# Patient Record
Sex: Female | Born: 1960
Health system: Southern US, Community
[De-identification: ages and names within clinical notes are randomized; demographics above are authoritative.]

## PROBLEM LIST (undated history)

## (undated) DIAGNOSIS — I10 Essential (primary) hypertension: Secondary | ICD-10-CM

## (undated) DIAGNOSIS — Z789 Other specified health status: Secondary | ICD-10-CM

## (undated) DIAGNOSIS — R32 Unspecified urinary incontinence: Secondary | ICD-10-CM

## (undated) DIAGNOSIS — F411 Generalized anxiety disorder: Secondary | ICD-10-CM

## (undated) DIAGNOSIS — E119 Type 2 diabetes mellitus without complications: Secondary | ICD-10-CM

## (undated) DIAGNOSIS — F32A Depression, unspecified: Secondary | ICD-10-CM

## (undated) DIAGNOSIS — R519 Headache, unspecified: Secondary | ICD-10-CM

## (undated) DIAGNOSIS — F329 Major depressive disorder, single episode, unspecified: Secondary | ICD-10-CM

## (undated) DIAGNOSIS — R51 Headache: Secondary | ICD-10-CM

## (undated) DIAGNOSIS — G43909 Migraine, unspecified, not intractable, without status migrainosus: Secondary | ICD-10-CM

## (undated) DIAGNOSIS — M2550 Pain in unspecified joint: Secondary | ICD-10-CM

## (undated) HISTORY — DX: Other specified health status: Z78.9

## (undated) HISTORY — DX: Generalized anxiety disorder: F41.1

## (undated) HISTORY — DX: Depression, unspecified: F32.A

## (undated) HISTORY — DX: Major depressive disorder, single episode, unspecified: F32.9

## (undated) HISTORY — DX: Headache, unspecified: R51.9

## (undated) HISTORY — DX: Migraine, unspecified, not intractable, without status migrainosus: G43.909

## (undated) HISTORY — DX: Morbid (severe) obesity due to excess calories: E66.01

## (undated) HISTORY — PX: KNEE ARTHROSCOPY: SHX127

## (undated) HISTORY — DX: Essential (primary) hypertension: I10

## (undated) HISTORY — DX: Type 2 diabetes mellitus without complications: E11.9

## (undated) HISTORY — DX: Pain in unspecified joint: M25.50

## (undated) HISTORY — DX: Unspecified urinary incontinence: R32

## (undated) HISTORY — DX: Headache: R51

---

## 1997-04-12 ENCOUNTER — Other Ambulatory Visit: Admission: RE | Admit: 1997-04-12 | Discharge: 1997-04-12 | Payer: Self-pay | Admitting: Obstetrics and Gynecology

## 1999-11-11 ENCOUNTER — Other Ambulatory Visit: Admission: RE | Admit: 1999-11-11 | Discharge: 1999-11-11 | Payer: Self-pay | Admitting: Obstetrics and Gynecology

## 2000-11-24 ENCOUNTER — Other Ambulatory Visit: Admission: RE | Admit: 2000-11-24 | Discharge: 2000-11-24 | Payer: Self-pay | Admitting: Obstetrics and Gynecology

## 2002-11-15 ENCOUNTER — Other Ambulatory Visit: Admission: RE | Admit: 2002-11-15 | Discharge: 2002-11-15 | Payer: Self-pay | Admitting: Obstetrics and Gynecology

## 2004-10-28 ENCOUNTER — Other Ambulatory Visit: Admission: RE | Admit: 2004-10-28 | Discharge: 2004-10-28 | Payer: Self-pay | Admitting: Obstetrics and Gynecology

## 2009-03-21 ENCOUNTER — Encounter: Admission: RE | Admit: 2009-03-21 | Discharge: 2009-03-21 | Payer: Self-pay | Admitting: Obstetrics and Gynecology

## 2011-05-06 ENCOUNTER — Other Ambulatory Visit: Payer: Self-pay | Admitting: Orthopedic Surgery

## 2013-04-11 ENCOUNTER — Other Ambulatory Visit: Payer: Self-pay | Admitting: Obstetrics and Gynecology

## 2013-04-11 DIAGNOSIS — R928 Other abnormal and inconclusive findings on diagnostic imaging of breast: Secondary | ICD-10-CM

## 2013-04-27 ENCOUNTER — Ambulatory Visit
Admission: RE | Admit: 2013-04-27 | Discharge: 2013-04-27 | Disposition: A | Discharge status: Home / Self Care | Payer: Commercial Managed Care - PPO | Source: Ambulatory Visit | Attending: Obstetrics and Gynecology | Admitting: Obstetrics and Gynecology

## 2013-04-27 ENCOUNTER — Encounter (INDEPENDENT_AMBULATORY_CARE_PROVIDER_SITE_OTHER): Payer: Self-pay

## 2013-04-27 DIAGNOSIS — R928 Other abnormal and inconclusive findings on diagnostic imaging of breast: Secondary | ICD-10-CM

## 2016-02-06 DIAGNOSIS — N390 Urinary tract infection, site not specified: Secondary | ICD-10-CM | POA: Diagnosis not present

## 2016-02-06 DIAGNOSIS — Z1231 Encounter for screening mammogram for malignant neoplasm of breast: Secondary | ICD-10-CM | POA: Diagnosis not present

## 2016-02-06 DIAGNOSIS — Z124 Encounter for screening for malignant neoplasm of cervix: Secondary | ICD-10-CM | POA: Diagnosis not present

## 2016-02-06 DIAGNOSIS — Z01419 Encounter for gynecological examination (general) (routine) without abnormal findings: Secondary | ICD-10-CM | POA: Diagnosis not present

## 2016-08-09 DIAGNOSIS — R5383 Other fatigue: Secondary | ICD-10-CM | POA: Diagnosis not present

## 2016-08-09 DIAGNOSIS — L739 Follicular disorder, unspecified: Secondary | ICD-10-CM | POA: Diagnosis not present

## 2016-08-09 DIAGNOSIS — E785 Hyperlipidemia, unspecified: Secondary | ICD-10-CM | POA: Diagnosis not present

## 2016-08-09 DIAGNOSIS — I10 Essential (primary) hypertension: Secondary | ICD-10-CM | POA: Diagnosis not present

## 2016-09-10 DIAGNOSIS — M1712 Unilateral primary osteoarthritis, left knee: Secondary | ICD-10-CM | POA: Diagnosis not present

## 2016-10-30 DIAGNOSIS — J209 Acute bronchitis, unspecified: Secondary | ICD-10-CM | POA: Diagnosis not present

## 2016-10-30 DIAGNOSIS — R062 Wheezing: Secondary | ICD-10-CM | POA: Diagnosis not present

## 2016-12-07 DIAGNOSIS — M1712 Unilateral primary osteoarthritis, left knee: Secondary | ICD-10-CM | POA: Diagnosis not present

## 2016-12-08 ENCOUNTER — Other Ambulatory Visit: Payer: Self-pay

## 2016-12-08 DIAGNOSIS — I83893 Varicose veins of bilateral lower extremities with other complications: Secondary | ICD-10-CM

## 2016-12-16 DIAGNOSIS — M1712 Unilateral primary osteoarthritis, left knee: Secondary | ICD-10-CM | POA: Diagnosis not present

## 2016-12-23 DIAGNOSIS — M1712 Unilateral primary osteoarthritis, left knee: Secondary | ICD-10-CM | POA: Diagnosis not present

## 2017-01-13 DIAGNOSIS — I10 Essential (primary) hypertension: Secondary | ICD-10-CM | POA: Diagnosis not present

## 2017-01-18 DIAGNOSIS — L300 Nummular dermatitis: Secondary | ICD-10-CM | POA: Diagnosis not present

## 2017-01-18 DIAGNOSIS — L299 Pruritus, unspecified: Secondary | ICD-10-CM | POA: Diagnosis not present

## 2017-01-21 ENCOUNTER — Encounter: Payer: Self-pay | Admitting: Vascular Surgery

## 2017-01-21 ENCOUNTER — Ambulatory Visit (HOSPITAL_COMMUNITY)
Admission: RE | Admit: 2017-01-21 | Discharge: 2017-01-21 | Disposition: A | Discharge status: Home / Self Care | Payer: Commercial Managed Care - PPO | Source: Ambulatory Visit | Attending: Vascular Surgery | Admitting: Vascular Surgery

## 2017-01-21 ENCOUNTER — Ambulatory Visit: Payer: Commercial Managed Care - PPO | Admitting: Vascular Surgery

## 2017-01-21 VITALS — BP 130/75 | HR 73 | Resp 18 | Ht 70.0 in | Wt 276.0 lb

## 2017-01-21 DIAGNOSIS — I83893 Varicose veins of bilateral lower extremities with other complications: Secondary | ICD-10-CM | POA: Insufficient documentation

## 2017-01-21 DIAGNOSIS — I83899 Varicose veins of unspecified lower extremities with other complications: Secondary | ICD-10-CM | POA: Diagnosis not present

## 2017-01-21 DIAGNOSIS — I872 Venous insufficiency (chronic) (peripheral): Secondary | ICD-10-CM | POA: Diagnosis not present

## 2017-01-21 DIAGNOSIS — R609 Edema, unspecified: Secondary | ICD-10-CM | POA: Diagnosis present

## 2017-01-21 HISTORY — DX: Varicose veins of unspecified lower extremity with other complications: I83.899

## 2017-01-21 HISTORY — DX: Venous insufficiency (chronic) (peripheral): I87.2

## 2017-01-21 NOTE — Progress Notes (Signed)
Reason for referral: bleeding episodes over patches of varicose veins left LE  History of Present Illness  Jennifer Bishop is a 57 y.o. female who presents with chief complaint:Visable swollen varicose veins over the left lateral malleolus.  She has had 3 bleeding episodes over the past few years that took > than an hour to stop the bleeding.  The patient has had no history of DVT, no history of varicose vein, no history of venous stasis ulcers, no history of  Lymphedema and no history of skin changes in lower legs.  There is postive family history of venous disorders.  The patient has  used compression stockings in the past.  She has had 2 children.    Past medical history includes: HTN managed with a beta blocker and fluid pills.  She denise CAD, MI, and DM.    Social History   Socioeconomic History  . Marital status: Married    Spouse name: Not on file  . Number of children: Not on file  . Years of education: Not on file  . Highest education level: Not on file  Social Needs  . Financial resource strain: Not on file  . Food insecurity - worry: Not on file  . Food insecurity - inability: Not on file  . Transportation needs - medical: Not on file  . Transportation needs - non-medical: Not on file  Occupational History  . Not on file  Tobacco Use  . Smoking status: Never Smoker  . Smokeless tobacco: Never Used  Substance and Sexual Activity  . Alcohol use: Not on file  . Drug use: Not on file  . Sexual activity: Not on file  Other Topics Concern  . Not on file  Social History Narrative  . Not on file    Family History: patient is unable to detail the medical history of his parents   Current Outpatient Medications on File Prior to Visit  Medication Sig Dispense Refill  . celecoxib (CELEBREX) 200 MG capsule TAKE 1 CAPSULE BY MOUTH EVERY DAY AS NEEDED  0  . DULoxetine (CYMBALTA) 30 MG capsule Take 30 mg by mouth daily.    . DULoxetine (CYMBALTA) 60 MG capsule Take 60 mg by  mouth daily.    . hydrochlorothiazide (HYDRODIURIL) 25 MG tablet     . propranolol ER (INDERAL LA) 80 MG 24 hr capsule      No current facility-administered medications on file prior to visit.     Allergies as of 01/21/2017  . (Not on File)    ROS:   General:  No weight loss, Fever, chills  HEENT: No recent headaches, no nasal bleeding, no visual changes, no sore throat  Neurologic: No dizziness, blackouts, seizures. No recent symptoms of stroke or mini- stroke. No recent episodes of slurred speech, or temporary blindness.  Cardiac: No recent episodes of chest pain/pressure, no shortness of breath at rest.  No shortness of breath with exertion.  Denies history of atrial fibrillation or irregular heartbeat  Vascular: No history of rest pain in feet.  No history of claudication.  No history of non-healing ulcer, No history of DVT   Pulmonary: No home oxygen, no productive cough, no hemoptysis,  No asthma or wheezing  Musculoskeletal:  [ ]  Arthritis, [ ]  Low back pain,  [ ]  Joint pain  Hematologic:No history of hypercoagulable state.  positive history of easy bleeding left lateral ankle.  No history of anemia  Gastrointestinal: No hematochezia or melena,  No  gastroesophageal reflux, no trouble swallowing  Urinary: [ ]  chronic Kidney disease, [ ]  on HD - [ ]  MWF or [ ]  TTHS, [ ]  Burning with urination, [ ]  Frequent urination, [ ]  Difficulty urinating;   Skin: No rashes  Psychological: No history of anxiety,  No history of depression  Physical Examination  Vitals:   01/21/17 1507  BP: 130/75  Pulse: 73  Resp: 18  SpO2: 96%  Weight: 276 lb (125.2 kg)  Height: 5\' 10"  (1.778 m)    Body mass index is 39.6 kg/m.  General:  Alert and oriented, no acute distress HEENT: Normal Neck: No bruit or JVD Pulmonary: Clear to auscultation bilaterally Cardiac: Regular Rate and Rhythm without murmur Abdomen: Soft, non-tender, non-distended, no mass, no scars Skin: dependent bluish  color to B le.  No wounds.  Multiple areas of spider veins over the feet and ankles.  Lateral cluster of varicose veins over the lateral malleolus.   Extremity Pulses:  2+ radial, brachial, femoral, dorsalis pedis, posterior tibial pulses bilaterally Musculoskeletal: pitting edema bilateral LE  Neurologic: Upper and lower extremity motor 5/5 and symmetric Psychiatric: Judgment intact, Mood & affect appropriate for pt's clinical situation Lymph : No Cervical, Axillary, or Inguinal lymphadenopathy   Non-invasive Vascular Imaging   LLE Venous Reflux Duplex (01/21/2017) Venous Reflux Times Normal value < 0.5 sec +--------------+----------+---------+        Right (ms)Left (ms) +--------------+----------+---------+ CFV           2890.0   +--------------+----------+---------+ GSV at groin      2846.0   +--------------+----------+---------+ GSV prox thigh     2406.0   +--------------+----------+---------+ GSV mid thigh      2500.0   +--------------+----------+---------+  Vein Diameters: +-------------------+----------+---------+           Right (mm)Left (mm) +-------------------+----------+---------+ GSV at SFJ          0.97    +-------------------+----------+---------+ GSV at prox thigh      0.53    +-------------------+----------+---------+ GSV at mid thigh       0.40    +-------------------+----------+---------+ GSV at distal thigh     0.32    +-------------------+----------+---------+ GSV at knee         0.33    +-------------------+----------+---------+ GSV prox calf        0.25    +-------------------+----------+---------+ SSV             0.26    +-------------------+----------+---------+  Final Interpretation: Left: Abnormal reflux times were noted in the common femoral vein, great saphenous vein at the groin,  great saphenous vein at the proximal thigh, and great saphenous vein at the mid thigh. There is no evidence of deep vein thrombosis in the lower  extremity. A cystic structure is found in the popliteal fossa.  *See table(s) above for measurements and observations.   Assessment: Venous insufficiency with reflux in the GSV. 3 episodes of varicose vein bleeding lateral malleolus left ankle   Plan: We will schedule her to follow up in our vein clinic on the next available appointment.   She will be considered for Phlebectomy verses sclerotherapy or combination of the 2.  She has high reflux times and may be considered for GSV ablation in the future as well.  She will continue to wear thigh high compression garments daily and elevation of her lower extremities when at rest.    Mosetta Pigeon PA-C [facility]  The patient was seen in conjunction with Dr.  Chen today  Addendum  I have independently interviewed and examined the patient, and I agree with the physician assistant's findings.  Pt has significant superficial and deep venous reflux but her primary sx is bleeding from a spider vein nest fed by a moderate sized lateral varicosities.    - Suspect will need a combination of stab phlebectomy and sclerotherapy   Leonides SakeBrian Chen, MD, FACS [facility] [place] Office: [phone] Pager: [phone]  01/21/2017, 4:14 PM

## 2017-02-01 ENCOUNTER — Ambulatory Visit: Payer: Commercial Managed Care - PPO | Admitting: Vascular Surgery

## 2017-02-01 ENCOUNTER — Encounter: Payer: Self-pay | Admitting: Vascular Surgery

## 2017-02-01 VITALS — BP 132/78 | HR 62 | Temp 98.7°F | Resp 18 | Ht 70.0 in | Wt 273.0 lb

## 2017-02-01 DIAGNOSIS — I83891 Varicose veins of right lower extremities with other complications: Secondary | ICD-10-CM

## 2017-02-01 DIAGNOSIS — I83892 Varicose veins of left lower extremities with other complications: Secondary | ICD-10-CM

## 2017-02-01 HISTORY — DX: Varicose veins of left lower extremity with other complications: I83.892

## 2017-02-01 NOTE — Progress Notes (Signed)
Subjective:     Patient ID: Jennifer Bishop, female   DOB: 03/01/1960, 57 y.o.   MRN: [id number]  HPI This 57 year old female returns for follow-up regarding her history of bleeding varicosities in the left leg. She was seen by Dr. Leonides SakeBrian Chen 2 weeks ago. She has had 3 episodes of bleeding over the past few years involving a varix at the lateral malleolar areain the left ankle. She has no history of DVT thrombophlebitis stasis ulcers. Her most recent episode of bleeding from the left lateral ankle was 5 weeks ago. She has been wearing short leg elastic compression stockings. She develops heavy aching discomfort in the legs as the day progresses and some swelling in the left ankle. This is affecting her daily living and is she is very concerned about further bleeding episodes.  Past Medical History:  Diagnosis Date  . Hypertension     Social History   Tobacco Use  . Smoking status: Never Smoker  . Smokeless tobacco: Never Used  Substance Use Topics  . Alcohol use: Not on file    Family History  Problem Relation Age of Onset  . Heart disease Son     Not on File   Current Outpatient Medications:  .  celecoxib (CELEBREX) 200 MG capsule, TAKE 1 CAPSULE BY MOUTH EVERY DAY AS NEEDED, Disp: , Rfl: 0 .  DULoxetine (CYMBALTA) 30 MG capsule, Take 30 mg by mouth daily., Disp: , Rfl:  .  DULoxetine (CYMBALTA) 60 MG capsule, Take 60 mg by mouth daily., Disp: , Rfl:  .  hydrochlorothiazide (HYDRODIURIL) 25 MG tablet, , Disp: , Rfl:  .  propranolol ER (INDERAL LA) 80 MG 24 hr capsule, , Disp: , Rfl:   Vitals:   02/01/17 1532  BP: 132/78  Pulse: 62  Resp: 18  Temp: 98.7 F (37.1 C)  TempSrc: Oral  SpO2: 96%  Weight: 273 lb (123.8 kg)  Height: 5\' 10"  (1.778 m)    Body mass index is 39.17 kg/m.         Review of Systems  Denies chest pain, dyspnea on exertion, PND, orthopnea, hemoptysis, claudication    Objective:   Physical Exam BP 132/78 (BP Location: Left Arm, Patient  Position: Sitting, Cuff Size: Normal)   Pulse 62   Temp 98.7 F (37.1 C) (Oral)   Resp 18   Ht 5\' 10"  (1.778 m)   Wt 273 lb (123.8 kg)   SpO2 96%   BMI 39.17 kg/m   Gen. Obese female no apparent distress alert and oriented 3 Lungs no rhonchi or wheezing Left leg withbulging varicosities in the distal lateral aspect extending down to the lateral malleolus where the bleeding sitewas located. Some smaller varicosities in the medial calf and some reticular veins. 1+ distal edema with no hyperpigmentation or ulceration noted.  I reviewed the previous reflux ultrasound exam from last visit and also performed an independent bedside SonoSite ultrasound exam which revealed enlargement of the left great saphenous vein with gross reflux down to the distal thigh     Assessment:     Gross reflux left great saphenous vein with pain and swelling and 3 episodes of on tiniest bleeding from the varix left ankle. This is affecting patient's dailyving and she is quite concerned about future bleeding episodes.    Plan:     Patient needs laser ablation left great saphenous vein followed by a session of foam sclerotherapy to treat the bleeding site. We will proceed with precertification to perform this in  the near future hopefully prevent further bleeding

## 2017-03-08 ENCOUNTER — Other Ambulatory Visit: Payer: Self-pay | Admitting: *Deleted

## 2017-03-08 DIAGNOSIS — I83892 Varicose veins of left lower extremities with other complications: Secondary | ICD-10-CM

## 2017-05-03 ENCOUNTER — Ambulatory Visit (INDEPENDENT_AMBULATORY_CARE_PROVIDER_SITE_OTHER): Payer: Commercial Managed Care - PPO | Admitting: Vascular Surgery

## 2017-05-03 ENCOUNTER — Encounter: Payer: Self-pay | Admitting: Vascular Surgery

## 2017-05-03 VITALS — BP 148/82 | HR 66 | Temp 98.0°F | Resp 16 | Ht 70.0 in | Wt 273.0 lb

## 2017-05-03 DIAGNOSIS — I83892 Varicose veins of left lower extremities with other complications: Secondary | ICD-10-CM

## 2017-05-03 HISTORY — PX: ENDOVENOUS ABLATION SAPHENOUS VEIN W/ LASER: SUR449

## 2017-05-03 NOTE — Progress Notes (Signed)
Laser Ablation Procedure    Date: 05/03/2017   Jennifer Bishop DOB:1960-12-02  Consent signed: Yes    Surgeon:  Dr. Quita Skye. [facility] [place]  Procedure: Laser Ablation: left Greater Saphenous Vein  BP (!) 148/82 (BP Location: Left Arm, Patient Position: Sitting, Cuff Size: Large)   Pulse 66   Temp 98 F (36.7 C) (Oral)   Resp 16   Ht  (1.778 m)   Wt 273 lb (123.8 kg)   SpO2 97%   BMI 39.17 kg/m   Tumescent Anesthesia: 350 cc 0.9% NaCl with 50 cc Lidocaine HCL 1% and 15 cc 8.4% NaHCO3  Local Anesthesia: 4 cc Lidocaine HCL and NaHCO3 (ratio 2:1)  Pulsed Mode: 15 watts, delay, 1.0 duration  Total Energy: 1541 Joules             Total Pulses: 103               Total Time: 1:43    Patient tolerated procedure well    Description of Procedure:  After marking the course of the secondary varicosities, the patient was placed on the operating table in the supine position, and the left leg was prepped and draped in sterile fashion.   Local anesthetic was administered and under ultrasound guidance the saphenous vein was accessed with a micro needle and guide wire; then the mirco puncture sheath was placed.  A guide wire was inserted saphenofemoral junction , followed by a 5 french sheath.  The position of the sheath and then the laser fiber below the junction was confirmed using the ultrasound.  Tumescent anesthesia was administered along the course of the saphenous vein using ultrasound guidance. The patient was placed in Trendelenburg position and protective laser glasses were placed on patient and staff, and the laser was fired at 15 watts continuous mode advancing 1-73mm/second for a total of 1541 joules.    Steri strip was applied to the IV insertion site and ABD pads and thigh high compression stockings were applied.  Ace wrap bandages were applied over the left calf and thigh and at the top of the saphenofemoral junction. Blood loss was less than 15 cc.  The patient ambulated out of the  operating room having tolerated the procedure well.

## 2017-05-03 NOTE — Progress Notes (Signed)
  Subjective:     Patient ID: Jennifer Bishop, female   DOB: 02-03-1960, 57 y.o.   MRN: [id number]  HPI This 57 year old female had laser ablation of the left great saphenous vein from the distal thigh to near the saphenofemoral junction performed under local tumescent anesthesia.  A total of 1541 J of energy was utilized.  She tolerated procedure well.  Review of Systems     Objective:   Physical Exam BP (!) 148/82 (BP Location: Left Arm, Patient Position: Sitting, Cuff Size: Large)   Pulse 66   Temp 98 F (36.7 C) (Oral)   Resp 16   Ht  (1.778 m)   Wt 273 lb (123.8 kg)   SpO2 97%   BMI 39.17 kg/m        Assessment:     Well-tolerated laser ablation left great saphenous vein performed under local tumescent anesthesia for gross reflux with history of bleeding from ankle varix on 3 occasions    Plan:     Return to see me in 1 week for venous duplex exam to confirm closure left great saphenous vein and patient will then have one course of foam sclerotherapy at the bleeding site

## 2017-05-05 DIAGNOSIS — M2550 Pain in unspecified joint: Secondary | ICD-10-CM | POA: Diagnosis not present

## 2017-05-05 DIAGNOSIS — M791 Myalgia, unspecified site: Secondary | ICD-10-CM | POA: Diagnosis not present

## 2017-05-05 DIAGNOSIS — M150 Primary generalized (osteo)arthritis: Secondary | ICD-10-CM | POA: Diagnosis not present

## 2017-05-09 ENCOUNTER — Other Ambulatory Visit: Payer: Self-pay

## 2017-05-09 ENCOUNTER — Encounter: Payer: Self-pay | Admitting: Vascular Surgery

## 2017-05-09 ENCOUNTER — Ambulatory Visit (HOSPITAL_COMMUNITY)
Admission: RE | Admit: 2017-05-09 | Discharge: 2017-05-09 | Disposition: A | Discharge status: Home / Self Care | Payer: Commercial Managed Care - PPO | Source: Ambulatory Visit | Attending: Vascular Surgery | Admitting: Vascular Surgery

## 2017-05-09 ENCOUNTER — Ambulatory Visit (INDEPENDENT_AMBULATORY_CARE_PROVIDER_SITE_OTHER): Payer: Commercial Managed Care - PPO | Admitting: Vascular Surgery

## 2017-05-09 VITALS — BP 134/73 | HR 68 | Resp 18 | Ht 70.0 in | Wt 285.0 lb

## 2017-05-09 DIAGNOSIS — I83892 Varicose veins of left lower extremities with other complications: Secondary | ICD-10-CM | POA: Diagnosis not present

## 2017-05-09 DIAGNOSIS — Z9889 Other specified postprocedural states: Secondary | ICD-10-CM | POA: Diagnosis not present

## 2017-05-09 NOTE — Progress Notes (Signed)
  Subjective:     Patient ID: Jennifer Bishop, female   DOB: October 31, 1960, 57 y.o.   MRN: [id number]  HPI This 57 year old female returns 1 week post laser ablation left great saphenous vein performed for gross reflux with painful varicosities and a history of bleeding.  She has worn her long-leg elastic compression stockings 20-30 mm gradient and taken ibuprofen as instructed.  He has had minimal discomfort in the left thigh and has had improvement in her distal edema.  Has no complaints.  She is had no recurrent bleeding.  Past Medical History:  Diagnosis Date  . Hypertension     Social History   Tobacco Use  . Smoking status: Never Smoker  . Smokeless tobacco: Never Used  Substance Use Topics  . Alcohol use: Not on file    Family History  Problem Relation Age of Onset  . Heart disease Son     No Known Allergies   Current Outpatient Medications:  .  celecoxib (CELEBREX) 200 MG capsule, TAKE 1 CAPSULE BY MOUTH EVERY DAY AS NEEDED, Disp: , Rfl: 0 .  DULoxetine (CYMBALTA) 30 MG capsule, Take 30 mg by mouth daily., Disp: , Rfl:  .  DULoxetine (CYMBALTA) 60 MG capsule, Take 60 mg by mouth daily., Disp: , Rfl:  .  hydrochlorothiazide (HYDRODIURIL) 25 MG tablet, , Disp: , Rfl:  .  propranolol ER (INDERAL LA) 80 MG 24 hr capsule, , Disp: , Rfl:   Vitals:   05/09/17 1402  BP: 134/73  Pulse: 68  Resp: 18  SpO2: 96%  Weight: 285 lb (129.3 kg)  Height:  (1.778 m)    Body mass index is 40.89 kg/m.         Review of Systems Denies chest pain, dyspnea on exertion, PND, orthopnea, hemoptysis, claudication    Objective:   Physical Exam BP 134/73 (BP Location: Left Arm, Patient Position: Sitting, Cuff Size: Normal)   Pulse 68   Resp 18   Ht  (1.778 m)   Wt 285 lb (129.3 kg)   SpO2 96%   BMI 40.89 kg/m   Obese female no apparent distress alert and oriented x3 Lungs no rhonchi or wheezing Left leg with mild discomfort to deep palpation of her great saphenous  vein with mild ecchymosis in mid to distal thigh.  Trace distal edema present.  3+ dorsalis pedis pulse palpable.  Today ordered a venous duplex exam of the left leg which I reviewed and interpreted.  There is no DVT.  There is total closure of the saphenous vein up to near the saphenofemoral junction     Assessment:     #1 successful laser ablation left great saphenous vein for gross reflux with swelling and history of bleeding left ankle with good early result    Plan:    Patient will arrange an appointment with Clementeen Hoof  in the near future for sclerotherapy to complete her treatment regimen

## 2017-05-10 ENCOUNTER — Ambulatory Visit: Payer: Commercial Managed Care - PPO | Admitting: Vascular Surgery

## 2017-05-10 ENCOUNTER — Encounter (HOSPITAL_COMMUNITY): Payer: Commercial Managed Care - PPO

## 2017-06-06 DIAGNOSIS — M1712 Unilateral primary osteoarthritis, left knee: Secondary | ICD-10-CM | POA: Diagnosis not present

## 2017-06-26 DIAGNOSIS — S335XXA Sprain of ligaments of lumbar spine, initial encounter: Secondary | ICD-10-CM | POA: Diagnosis not present

## 2017-07-10 DIAGNOSIS — M545 Low back pain: Secondary | ICD-10-CM | POA: Diagnosis not present

## 2017-07-10 DIAGNOSIS — S3992XA Unspecified injury of lower back, initial encounter: Secondary | ICD-10-CM | POA: Diagnosis not present

## 2017-07-15 DIAGNOSIS — M545 Low back pain: Secondary | ICD-10-CM | POA: Diagnosis not present

## 2017-07-28 DIAGNOSIS — M461 Sacroiliitis, not elsewhere classified: Secondary | ICD-10-CM | POA: Diagnosis not present

## 2017-07-28 DIAGNOSIS — M7611 Psoas tendinitis, right hip: Secondary | ICD-10-CM | POA: Diagnosis not present

## 2017-07-28 DIAGNOSIS — M5417 Radiculopathy, lumbosacral region: Secondary | ICD-10-CM | POA: Diagnosis not present

## 2017-08-05 DIAGNOSIS — M7611 Psoas tendinitis, right hip: Secondary | ICD-10-CM | POA: Diagnosis not present

## 2017-08-05 DIAGNOSIS — M5417 Radiculopathy, lumbosacral region: Secondary | ICD-10-CM | POA: Diagnosis not present

## 2017-08-05 DIAGNOSIS — M461 Sacroiliitis, not elsewhere classified: Secondary | ICD-10-CM | POA: Diagnosis not present

## 2017-09-02 DIAGNOSIS — M47896 Other spondylosis, lumbar region: Secondary | ICD-10-CM | POA: Diagnosis not present

## 2017-09-02 DIAGNOSIS — M13851 Other specified arthritis, right hip: Secondary | ICD-10-CM | POA: Diagnosis not present

## 2017-09-06 DIAGNOSIS — Z6841 Body Mass Index (BMI) 40.0 and over, adult: Secondary | ICD-10-CM | POA: Diagnosis not present

## 2017-09-06 DIAGNOSIS — R5383 Other fatigue: Secondary | ICD-10-CM | POA: Diagnosis not present

## 2017-09-06 DIAGNOSIS — N390 Urinary tract infection, site not specified: Secondary | ICD-10-CM | POA: Diagnosis not present

## 2017-09-06 DIAGNOSIS — Z131 Encounter for screening for diabetes mellitus: Secondary | ICD-10-CM | POA: Diagnosis not present

## 2017-09-06 DIAGNOSIS — Z13220 Encounter for screening for lipoid disorders: Secondary | ICD-10-CM | POA: Diagnosis not present

## 2017-09-06 DIAGNOSIS — I10 Essential (primary) hypertension: Secondary | ICD-10-CM | POA: Diagnosis not present

## 2017-09-12 DIAGNOSIS — Z6841 Body Mass Index (BMI) 40.0 and over, adult: Secondary | ICD-10-CM | POA: Diagnosis not present

## 2017-09-12 DIAGNOSIS — E1165 Type 2 diabetes mellitus with hyperglycemia: Secondary | ICD-10-CM | POA: Diagnosis not present

## 2017-10-14 DIAGNOSIS — Z1231 Encounter for screening mammogram for malignant neoplasm of breast: Secondary | ICD-10-CM | POA: Diagnosis not present

## 2017-10-14 DIAGNOSIS — Z01419 Encounter for gynecological examination (general) (routine) without abnormal findings: Secondary | ICD-10-CM | POA: Diagnosis not present

## 2017-11-01 DIAGNOSIS — M1712 Unilateral primary osteoarthritis, left knee: Secondary | ICD-10-CM | POA: Diagnosis not present

## 2017-11-04 DIAGNOSIS — E1165 Type 2 diabetes mellitus with hyperglycemia: Secondary | ICD-10-CM | POA: Diagnosis not present

## 2017-11-04 DIAGNOSIS — Z6841 Body Mass Index (BMI) 40.0 and over, adult: Secondary | ICD-10-CM | POA: Diagnosis not present

## 2018-01-13 DIAGNOSIS — E1165 Type 2 diabetes mellitus with hyperglycemia: Secondary | ICD-10-CM | POA: Diagnosis not present

## 2018-01-19 DIAGNOSIS — Z6841 Body Mass Index (BMI) 40.0 and over, adult: Secondary | ICD-10-CM | POA: Diagnosis not present

## 2018-01-19 DIAGNOSIS — E1165 Type 2 diabetes mellitus with hyperglycemia: Secondary | ICD-10-CM | POA: Diagnosis not present

## 2018-01-25 DIAGNOSIS — E1165 Type 2 diabetes mellitus with hyperglycemia: Secondary | ICD-10-CM | POA: Diagnosis not present

## 2018-02-21 DIAGNOSIS — M25562 Pain in left knee: Secondary | ICD-10-CM | POA: Diagnosis not present

## 2018-02-21 DIAGNOSIS — M1712 Unilateral primary osteoarthritis, left knee: Secondary | ICD-10-CM | POA: Diagnosis not present

## 2018-03-17 DIAGNOSIS — J302 Other seasonal allergic rhinitis: Secondary | ICD-10-CM | POA: Diagnosis not present

## 2018-03-17 DIAGNOSIS — Z6841 Body Mass Index (BMI) 40.0 and over, adult: Secondary | ICD-10-CM | POA: Diagnosis not present

## 2018-04-27 ENCOUNTER — Ambulatory Visit: Payer: Commercial Managed Care - PPO | Admitting: Allergy and Immunology

## 2018-04-27 ENCOUNTER — Other Ambulatory Visit: Payer: Self-pay

## 2018-04-27 ENCOUNTER — Encounter: Payer: Self-pay | Admitting: Allergy and Immunology

## 2018-04-27 VITALS — BP 152/82 | HR 56 | Temp 98.1°F | Resp 16 | Ht 67.6 in | Wt 264.8 lb

## 2018-04-27 DIAGNOSIS — H1010 Acute atopic conjunctivitis, unspecified eye: Secondary | ICD-10-CM | POA: Diagnosis not present

## 2018-04-27 DIAGNOSIS — J3089 Other allergic rhinitis: Secondary | ICD-10-CM | POA: Diagnosis not present

## 2018-04-27 DIAGNOSIS — Z79899 Other long term (current) drug therapy: Secondary | ICD-10-CM

## 2018-04-27 MED ORDER — HYDROCHLOROTHIAZIDE 12.5 MG PO TABS: 12.5000 mg | ORAL_TABLET | Freq: Every day | ORAL | 1 refills | Status: DC

## 2018-04-27 MED ORDER — LOSARTAN POTASSIUM 50 MG PO TABS: 50.0000 mg | ORAL_TABLET | Freq: Every day | ORAL | 1 refills | Status: DC

## 2018-04-27 MED ORDER — MONTELUKAST SODIUM 10 MG PO TABS: 10.0000 mg | ORAL_TABLET | Freq: Every day | ORAL | 1 refills | Status: DC

## 2018-04-27 NOTE — Patient Instructions (Addendum)
  1.  Allergen avoidance measures?  2.  Treat and prevent inflammation:   A.  OTC Nasacort-1-2 sprays each nostril daily  B.  Montelukast 10 mg - 1 tablet daily  C.  Prednisone 10 mg - 1 tablet daily for 5 days only (sugars)  3.  Change lisinopril- hctz to:   A.  Losartan 50 mg -1 tablet daily  B.  Hydrochlorothiazide -12.5 mg 1 tablet daily  C.  Check blood pressure  4.  If needed:   A.  Nasal saline  B.  OTC Pataday 1 drop each eye daily  C.  OTC Allegra 180- 1 tablet daily  5.  Return to clinic in 3 weeks or earlier if problem

## 2018-04-27 NOTE — Progress Notes (Signed)
[place] - [place] - [place] - [place] - [place]   Dear Dr. Jeanie Sewer,  Thank you for referring Jennifer Bishop to the [facility] [place] on 04/27/2018.   Below is a summation of this patient's evaluation and recommendations.  Thank you for your referral. I will keep you informed about this patient's response to treatment.   If you have any questions please do not hesitate to contact me.   Sincerely,  Jessica Priest, MD Allergy / Immunology [facility]   ______________________________________________________________________    NEW PATIENT NOTE  Referring Provider: Noni Saupe, MD Primary Provider: Noni Saupe, MD Date of office visit: 04/27/2018    Subjective:   Chief Complaint:  Jennifer Bishop (DOB: May 21, 1960) is a 58 y.o. female who presents to the clinic on 04/27/2018 with a chief complaint of Itchy Eye and Cough .     HPI: Jennifer Bishop presents to this clinic in evaluation of allergic problems.  Jennifer Bishop states that since the very beginning of March she has had issues with itchy watery eyes and some sneezing and very itchy throat and some throat clearing and coughing.  She has not had any ugly nasal discharge or anosmia or headaches or shortness of breath or chest tightness or chest pain.  She does not really note any obvious provoking factor giving rise to this issue.  She has tried various antihistamines and feels as though Jennifer Bishop works the best.  It should be noted that she was started on lisinopril sometime in 2020 as hydrochlorothiazide was inadequate in controlling her blood pressure.  She actually feels as though her blood pressure is less well managed since starting lisinopril.  Past Medical History:  Diagnosis Date  . Depression   . Diabetes (HCC)   . Headache   . Hypertension   . Urinary incontinence     Past Surgical History:  Procedure Laterality Date  .  ENDOVENOUS ABLATION SAPHENOUS VEIN W/ LASER Left 05/03/2017   endovenous laser ablation Left greater saphenous vein by Josephina Gip MD   . KNEE ARTHROSCOPY     1993 and 2015    Allergies as of 04/27/2018   No Known Allergies     Medication List      aspirin 81 MG tablet Take 81 mg by mouth daily.   DULoxetine 60 MG capsule Commonly known as:  CYMBALTA Take 60 mg by mouth daily.   DULoxetine 30 MG capsule Commonly known as:  CYMBALTA Take 30 mg by mouth daily.   lisinopril-hydrochlorothiazide 10-12.5 MG tablet Commonly known as:  ZESTORETIC   metFORMIN 500 MG 24 hr tablet Commonly known as:  GLUCOPHAGE-XR Take 1,500 mg by mouth daily.   MULTIVITAMIN WOMEN PO Take by mouth daily.   naproxen 500 MG tablet Commonly known as:  NAPROSYN Take 500 mg by mouth 2 (two) times a day.   oxybutynin 5 MG 24 hr tablet Commonly known as:  DITROPAN-XL Take 5 mg by mouth daily.   pravastatin 20 MG tablet Commonly known as:  PRAVACHOL Take 20 mg by mouth daily.   propranolol ER 80 MG 24 hr capsule Commonly known as:  INDERAL LA Take 80 mg by mouth daily.       Review of systems negative except as noted in HPI / PMHx or noted below:  Review of Systems  Constitutional: Negative.   HENT: Negative.   Eyes: Negative.   Respiratory: Negative.  Cardiovascular: Negative.   Gastrointestinal: Negative.   Genitourinary: Negative.   Musculoskeletal: Negative.   Skin: Negative.   Neurological: Negative.   Endo/Heme/Allergies: Negative.   Psychiatric/Behavioral: Negative.     Family History  Problem Relation Age of Onset  . Heart disease Son   . Heart attack Son   . Congestive Heart Failure Mother   . Heart disease Mother   . Heart attack Father   . Diabetes Father   . COPD Father   . Asthma Sister     Social History   Socioeconomic History  . Marital status: Married    Spouse name: Not on file  . Number of children: Not on file  . Years of education: Not on file   . Highest education level: Not on file  Occupational History  . Not on file  Social Needs  . Financial resource strain: Not on file  . Food insecurity:    Worry: Not on file    Inability: Not on file  . Transportation needs:    Medical: Not on file    Non-medical: Not on file  Tobacco Use  . Smoking status: Never Smoker  . Smokeless tobacco: Never Used  Substance and Sexual Activity  . Alcohol use: Never    Frequency: Never  . Drug use: Never  . Sexual activity: Not on file  Lifestyle  . Physical activity:    Days per week: Not on file    Minutes per session: Not on file  . Stress: Not on file  Relationships  . Social connections:    Talks on phone: Not on file    Gets together: Not on file    Attends religious service: Not on file    Active member of club or organization: Not on file    Attends meetings of clubs or organizations: Not on file    Relationship status: Not on file  . Intimate partner violence:    Fear of current or ex partner: Not on file    Emotionally abused: Not on file    Physically abused: Not on file    Forced sexual activity: Not on file  Other Topics Concern  . Not on file  Social History Narrative  . Not on file    Environmental and Social history  Lives in a house with a dry environment, cats located inside the household, no carpet in the bedroom, no plastic on the bed, no plastic on the pillow, no smoking ongoing with inside the household.  She works in an office setting.  Objective:   Vitals:   04/27/18 1351  BP: (!) 152/82  Pulse: (!) 56  Resp: 16  Temp: 98.1 F (36.7 C)  SpO2: 98%   Height: 5' 7.6" (171.7 cm) Weight: 264 lb 12.8 oz (120.1 kg)  Physical Exam Constitutional:      Appearance: She is not diaphoretic.  HENT:     Head: Normocephalic. No right periorbital erythema or left periorbital erythema.     Right Ear: Tympanic membrane, ear canal and external ear normal.     Left Ear: Tympanic membrane, ear canal and  external ear normal.     Nose: Mucosal edema present. No rhinorrhea.     Mouth/Throat:     Pharynx: No oropharyngeal exudate.  Eyes:     General: Lids are normal.     Conjunctiva/sclera:     Right eye: Right conjunctiva is injected.     Left eye: Left conjunctiva is injected.  Pupils: Pupils are equal, round, and reactive to light.  Neck:     Thyroid: No thyromegaly.     Trachea: Trachea normal. No tracheal deviation.  Cardiovascular:     Rate and Rhythm: Normal rate and regular rhythm.     Heart sounds: Normal heart sounds, S1 normal and S2 normal. No murmur.  Pulmonary:     Effort: Pulmonary effort is normal. No respiratory distress.     Breath sounds: No stridor. No wheezing or rales.  Chest:     Chest wall: No tenderness.  Abdominal:     General: There is no distension.     Palpations: Abdomen is soft. There is no mass.     Tenderness: There is no abdominal tenderness. There is no guarding or rebound.  Musculoskeletal:        General: No tenderness.  Lymphadenopathy:     Head:     Right side of head: No tonsillar adenopathy.     Left side of head: No tonsillar adenopathy.     Cervical: No cervical adenopathy.  Skin:    Coloration: Skin is not pale.     Findings: No erythema or rash.     Nails: There is no clubbing.   Neurological:     Mental Status: She is alert.     Diagnostics: Allergy skin tests were performed.  She did not demonstrate any hypersensitivity against a screening panel of aeroallergens or foods.  Assessment and Plan:    1. Perennial allergic rhinitis   2. Seasonal allergic conjunctivitis   3. On angiotensin-converting enzyme (ACE) inhibitors     1.  Allergen avoidance measures?  2.  Treat and prevent inflammation:   A.  OTC Nasacort-1-2 sprays each nostril daily  B.  Montelukast 10 mg - 1 tablet daily  C.  Prednisone 10 mg - 1 tablet daily for 5 days only (sugars)  3.  Change lisinopril- hctz to:   A.  Losartan 50 mg -1 tablet daily   B.  Hydrochlorothiazide -12.5 mg 1 tablet daily  C.  Check blood pressure  4.  If needed:   A.  Nasal saline  B.  OTC Pataday 1 drop each eye daily  C.  OTC Allegra 180- 1 tablet daily  5.  Return to clinic in 3 weeks or earlier if problem  [place] appears to have some inflammation of her airway and we will start her on anti-inflammatory agents as noted above.  I could not really provide her any allergen avoidance measures as she did not demonstrate any hypersensitivity against our screening panel of aeroallergens during today's visit.  Some of your irritation of her airway may also be used to the recent introduction of lisinopril and will change her blood pressure medicine as noted above.  I will see her back in this clinic in 3 weeks to make a decision about further evaluation and treatment based upon her response.  Jessica Priest, MD Allergy / Immunology [facility]

## 2018-05-01 ENCOUNTER — Encounter: Payer: Self-pay | Admitting: Allergy and Immunology

## 2018-05-04 ENCOUNTER — Other Ambulatory Visit: Payer: Self-pay | Admitting: *Deleted

## 2018-05-04 ENCOUNTER — Telehealth: Payer: Self-pay | Admitting: *Deleted

## 2018-05-04 MED ORDER — LOSARTAN POTASSIUM 50 MG PO TABS: 50.0000 mg | ORAL_TABLET | Freq: Every day | ORAL | 1 refills | Status: DC

## 2018-05-04 NOTE — Telephone Encounter (Signed)
Jennifer Bishop is unable to get the Losartan that you prescribed. She states that the pharmacy told her it was unavailable now. Do you want to change it to something else? She did pick up the HCTZ.

## 2018-05-04 NOTE — Telephone Encounter (Signed)
Made some calls, there are still some current recalls but there are some pharmacies that have Losartan available that is not on the recall list. I will send the rx to [facility], pt aware.

## 2018-05-04 NOTE — Telephone Encounter (Signed)
All pharmacies? All distributors? She needs Losartan.

## 2018-05-18 ENCOUNTER — Ambulatory Visit: Payer: Commercial Managed Care - PPO | Admitting: Allergy and Immunology

## 2018-05-18 ENCOUNTER — Encounter: Payer: Self-pay | Admitting: Allergy and Immunology

## 2018-05-18 ENCOUNTER — Other Ambulatory Visit: Payer: Self-pay

## 2018-05-18 VITALS — BP 130/74 | HR 60 | Temp 98.2°F | Resp 16

## 2018-05-18 DIAGNOSIS — H1010 Acute atopic conjunctivitis, unspecified eye: Secondary | ICD-10-CM

## 2018-05-18 DIAGNOSIS — J3089 Other allergic rhinitis: Secondary | ICD-10-CM

## 2018-05-18 NOTE — Progress Notes (Signed)
[place]   Follow-up Note  Referring Provider: Noni Saupeedding, John F. II, MD Primary Provider: Noni Saupeedding, John F. II, MD Date of Office Visit: 05/18/2018  Subjective:   Jennifer Bishop (DOB: 10/02/1960) is a 58 y.o. female who [facility] the [facility] on 05/18/2018 in re-evaluation of the following:  HPI: Jennifer Bishop returns to this clinic in reevaluation of respiratory tract symptoms believed secondary to a component of airway inflammation and reflux and ACE inhibitor use addressed during her initial evaluation of 27 April 2018.  She is over 90% better regarding her cough.  She still has a little bit of throat clearing but her throat issue has improved significantly.  She has no upper airway symptoms.  She continues on montelukast but no longer uses any of the other medications prescribed for her airway during her last visit.  She does continue on her losartan and hydrochlorothiazide.  Allergies as of 05/18/2018   No Known Allergies     Medication List      aspirin 81 MG tablet Take 81 mg by mouth daily.   DULoxetine 60 MG capsule Commonly known as:  CYMBALTA Take 60 mg by mouth daily.   DULoxetine 30 MG capsule Commonly known as:  CYMBALTA Take 30 mg by mouth daily.   hydrochlorothiazide 12.5 MG tablet Commonly known as:  HYDRODIURIL Take 1 tablet (12.5 mg total) by mouth daily.   losartan 50 MG tablet Commonly known as:  COZAAR Take 1 tablet (50 mg total) by mouth daily.   metFORMIN 500 MG 24 hr tablet Commonly known as:  GLUCOPHAGE-XR Take 1,500 mg by mouth daily.   montelukast 10 MG tablet Commonly known as:  SINGULAIR Take 1 tablet (10 mg total) by mouth at bedtime.   MULTIVITAMIN WOMEN PO Take by mouth daily.   naproxen 500 MG tablet Commonly known as:  NAPROSYN Take 500 mg by mouth 2 (two) times a day.   Nasacort Allergy 24HR 55 MCG/ACT Aero nasal inhaler Generic drug:  triamcinolone Place 2 sprays  into the nose daily.   oxybutynin 5 MG 24 hr tablet Commonly known as:  DITROPAN-XL Take 5 mg by mouth daily.   pravastatin 20 MG tablet Commonly known as:  PRAVACHOL Take 20 mg by mouth daily.   propranolol ER 80 MG 24 hr capsule Commonly known as:  INDERAL LA Take 80 mg by mouth daily.       Past Medical History:  Diagnosis Date  . Depression   . Diabetes (HCC)   . Headache   . Hypertension   . Urinary incontinence     Past Surgical History:  Procedure Laterality Date  . ENDOVENOUS ABLATION SAPHENOUS VEIN W/ LASER Left 05/03/2017   endovenous laser ablation Left greater saphenous vein by Josephina GipJames [place] MD   . KNEE ARTHROSCOPY     1993 and 2015    Review of systems negative except as noted in HPI / PMHx or noted below:  Review of Systems  Constitutional: Negative.   HENT: Negative.   Eyes: Negative.   Respiratory: Negative.   Cardiovascular: Negative.   Gastrointestinal: Negative.   Genitourinary: Negative.   Musculoskeletal: Negative.   Skin: Negative.   Neurological: Negative.   Endo/Heme/Allergies: Negative.   Psychiatric/Behavioral: Negative.      Objective:   Vitals:   05/18/18 1141  BP: 130/74  Pulse: 60  Resp: 16  Temp: 98.2 F (36.8 C)  Physical Exam Constitutional:      Appearance: She is not diaphoretic.     Comments: Throat clearing  HENT:     Head: Normocephalic.     Right Ear: Tympanic membrane, ear canal and external ear normal.     Left Ear: Tympanic membrane, ear canal and external ear normal.     Nose: Nose normal. No mucosal edema or rhinorrhea.     Mouth/Throat:     Pharynx: Uvula midline. No oropharyngeal exudate.  Eyes:     Conjunctiva/sclera: Conjunctivae normal.  Neck:     Thyroid: No thyromegaly.     Trachea: Trachea normal. No tracheal tenderness or tracheal deviation.  Cardiovascular:     Rate and Rhythm: Normal rate and regular rhythm.     Heart sounds: Normal heart sounds, S1 normal and S2 normal. No  murmur.  Pulmonary:     Effort: No respiratory distress.     Breath sounds: Normal breath sounds. No stridor. No wheezing or rales.  Lymphadenopathy:     Head:     Right side of head: No tonsillar adenopathy.     Left side of head: No tonsillar adenopathy.     Cervical: No cervical adenopathy.  Skin:    Findings: No erythema or rash.     Nails: There is no clubbing.   Neurological:     Mental Status: She is alert.     Diagnostics: none   Assessment and Plan:   1. Perennial allergic rhinitis   2. Seasonal allergic conjunctivitis     1.  Treat and prevent inflammation:   A.  Montelukast 10 mg - 1 tablet daily  2. Replace throat clearing with swallowing maneuver  3.  If needed:   A.  Nasal saline  B.  OTC Pataday 1 drop each eye daily  C.  OTC Allegra 180- 1 tablet daily  4.  Therapy for reflux/LPR?  5.   Return to clinic in Summer 2020 or earlier if problem  6.   Follow up with primary care doctor about blood pressure.   Jennifer Bishop appears to be doing much better regarding her cough and respiratory tract symptoms and she will continue to use montelukast at this point and remain away from an ACE inhibitor and I have asked her to replace her throat clearing with a swallowing maneuver and also to address the issue of her caffeine use and chocolate use.  She very well could have a component of LPR which we are not going to treat pharmacologically today.  If she cannot get rid of her throat clearing then we need to pursue further evaluation for LPR and other etiologies contributing to throat clearing.  We will see her back in this clinic in the summer 2020 or earlier if there is a problem.  Jennifer Schimke, MD Allergy / Immunology [facility]

## 2018-05-18 NOTE — Patient Instructions (Addendum)
  1.  Treat and prevent inflammation:   A.  Montelukast 10 mg - 1 tablet daily  2. Replace throat clearing with swallowing maneuver  3.  If needed:   A.  Nasal saline  B.  OTC Pataday 1 drop each eye daily  C.  OTC Allegra 180- 1 tablet daily  4.  Therapy for reflux/LPR?  5.   Return to clinic in Summer 2020 or earlier if problem  6.   Follow up with primary care doctor about blood pressure.

## 2018-05-22 ENCOUNTER — Encounter: Payer: Self-pay | Admitting: Allergy and Immunology

## 2018-05-31 ENCOUNTER — Other Ambulatory Visit: Payer: Self-pay | Admitting: *Deleted

## 2018-05-31 MED ORDER — LOSARTAN POTASSIUM 50 MG PO TABS: 50.0000 mg | ORAL_TABLET | Freq: Every day | ORAL | 1 refills | Status: DC

## 2018-05-31 MED ORDER — HYDROCHLOROTHIAZIDE 12.5 MG PO TABS: 12.5000 mg | ORAL_TABLET | Freq: Every day | ORAL | 1 refills | Status: DC

## 2018-05-31 MED ORDER — MONTELUKAST SODIUM 10 MG PO TABS: 10.0000 mg | ORAL_TABLET | Freq: Every day | ORAL | 1 refills | Status: DC

## 2018-06-22 ENCOUNTER — Other Ambulatory Visit: Payer: Self-pay | Admitting: Allergy and Immunology

## 2018-07-01 ENCOUNTER — Other Ambulatory Visit: Payer: Self-pay | Admitting: Allergy and Immunology

## 2018-11-09 ENCOUNTER — Other Ambulatory Visit: Payer: Self-pay | Admitting: Allergy and Immunology

## 2018-11-17 ENCOUNTER — Emergency Department (HOSPITAL_COMMUNITY)
Admission: EM | Admit: 2018-11-17 | Discharge: 2018-11-17 | Disposition: A | Discharge status: Home / Self Care | Payer: Commercial Managed Care - PPO | Attending: Emergency Medicine | Admitting: Emergency Medicine

## 2018-11-17 ENCOUNTER — Emergency Department (HOSPITAL_COMMUNITY): Payer: Commercial Managed Care - PPO

## 2018-11-17 ENCOUNTER — Encounter (HOSPITAL_COMMUNITY): Payer: Self-pay | Admitting: Emergency Medicine

## 2018-11-17 ENCOUNTER — Other Ambulatory Visit: Payer: Self-pay

## 2018-11-17 DIAGNOSIS — R600 Localized edema: Secondary | ICD-10-CM | POA: Insufficient documentation

## 2018-11-17 DIAGNOSIS — R0789 Other chest pain: Secondary | ICD-10-CM | POA: Diagnosis present

## 2018-11-17 DIAGNOSIS — Z7984 Long term (current) use of oral hypoglycemic drugs: Secondary | ICD-10-CM | POA: Insufficient documentation

## 2018-11-17 DIAGNOSIS — E119 Type 2 diabetes mellitus without complications: Secondary | ICD-10-CM | POA: Insufficient documentation

## 2018-11-17 DIAGNOSIS — R609 Edema, unspecified: Secondary | ICD-10-CM

## 2018-11-17 DIAGNOSIS — I10 Essential (primary) hypertension: Secondary | ICD-10-CM | POA: Insufficient documentation

## 2018-11-17 DIAGNOSIS — Z7982 Long term (current) use of aspirin: Secondary | ICD-10-CM | POA: Insufficient documentation

## 2018-11-17 DIAGNOSIS — Z79899 Other long term (current) drug therapy: Secondary | ICD-10-CM | POA: Insufficient documentation

## 2018-11-17 DIAGNOSIS — R519 Headache, unspecified: Secondary | ICD-10-CM | POA: Insufficient documentation

## 2018-11-17 LAB — BASIC METABOLIC PANEL
Anion gap: 15 (ref 5–15)
BUN: 15 mg/dL (ref 6–20)
CO2: 22 mmol/L (ref 22–32)
Calcium: 10 mg/dL (ref 8.9–10.3)
Chloride: 100 mmol/L (ref 98–111)
Creatinine, Ser: 0.58 mg/dL (ref 0.44–1.00)
GFR calc Af Amer: >60 mL/min (ref >60)
GFR calc non Af Amer: >60 mL/min (ref >60)
Glucose, Bld: 216 mg/dL — ABNORMAL HIGH (ref 70–99)
Potassium: 4.4 mmol/L (ref 3.5–5.1)
Sodium: 137 mmol/L (ref 135–145)

## 2018-11-17 LAB — CBC
HCT: 40.5 % (ref 36.0–46.0)
Hemoglobin: 14.4 g/dL (ref 12.0–15.0)
MCH: 33 pg (ref 26.0–34.0)
MCHC: 35.6 g/dL (ref 30.0–36.0)
MCV: 92.7 fL (ref 80.0–100.0)
Platelets: 346 10*3/uL (ref 150–400)
RBC: 4.37 MIL/uL (ref 3.87–5.11)
RDW: 12.4 % (ref 11.5–15.5)
WBC: 11.7 10*3/uL — ABNORMAL HIGH (ref 4.0–10.5)
nRBC: 0 % (ref 0.0–0.2)

## 2018-11-17 LAB — TROPONIN I (HIGH SENSITIVITY)
Troponin I (High Sensitivity): 4 ng/L (ref <18)
Troponin I (High Sensitivity): 4 ng/L (ref <18)

## 2018-11-17 LAB — BRAIN NATRIURETIC PEPTIDE: B Natriuretic Peptide: 93.8 pg/mL (ref 0.0–100.0)

## 2018-11-17 LAB — I-STAT BETA HCG BLOOD, ED (MC, WL, AP ONLY): I-stat hCG, quantitative: <5 m[IU]/mL (ref <5)

## 2018-11-17 MED ORDER — SODIUM CHLORIDE 0.9% FLUSH: 3.0000 mL | Freq: Once | INTRAVENOUS | Status: DC

## 2018-11-17 NOTE — ED Provider Notes (Signed)
[facility] EMERGENCY DEPARTMENT Provider Note   CSN: [id number] Arrival date & time: 11/17/18  1017   History   Chief Complaint Chief Complaint  Patient presents with  . Chest Pain    HPI Jennifer Bishop is a 58 y.o. female with history of hypertension and diabetes who presents with chest pain.  Patient states that for the past 2 to 3 weeks she has had intermittent episodes of chest tightness.  It is always in the left upper chest and radiates to the left breast.  Nothing makes it better.  It usually occurs randomly.  Sometimes it will occur at rest but also seems to be worse when she is walking uphill.  She is also been having bilateral lower extremity swelling in her feet and ankles.  She reports a history of varicose veins and states that this is a chronic problem for her but a little bit worse than normal.  She reports a chronic headache which is unchanged.  Last night she felt the chest tightness more frequently and felt a "startled feeling" in her chest. She felt it this morning as well but it wasn't as bad so she decided to come to the ED to get checked out. No fever, chills, current chest pain, shortness of breath, cough, wheezing, abdominal pain, nausea, vomiting, diaphoresis.  States both her parents died of a heart attack.  She has never seen a cardiologist before.  She is not a smoker. No recent surgery/travel/immobilization, hx of cancer, hemoptysis, prior DVT/PE, or hormone use.   HPI  Past Medical History:  Diagnosis Date  . Depression   . Diabetes (HCC)   . Headache   . Hypertension   . Urinary incontinence     Patient Active Problem List   Diagnosis Date Noted  . Varicose veins of left lower extremity with complications 02/01/2017  . Bleeding from varicose vein 01/21/2017  . Venous (peripheral) insufficiency 01/21/2017    Past Surgical History:  Procedure Laterality Date  . ENDOVENOUS ABLATION SAPHENOUS VEIN W/ LASER Left 05/03/2017   endovenous  laser ablation Left greater saphenous vein by Josephina Gip MD   . KNEE ARTHROSCOPY     1993 and 2015     OB History   No obstetric history on file.      Home Medications    Prior to Admission medications   Medication Sig Start Date End Date Taking? Authorizing Provider  aspirin 81 MG tablet Take 81 mg by mouth daily.    [provider]  DULoxetine (CYMBALTA) 30 MG capsule Take 30 mg by mouth daily.    [provider]  DULoxetine (CYMBALTA) 60 MG capsule Take 60 mg by mouth daily.    [provider]  hydrochlorothiazide (HYDRODIURIL) 12.5 MG tablet TAKE 1 TABLET DAILY 11/09/18   Kozlow, Alvira Philips, MD  losartan (COZAAR) 50 MG tablet Take 1 tablet (50 mg total) by mouth daily. 06/22/18   Kozlow, Alvira Philips, MD  metFORMIN (GLUCOPHAGE-XR) 500 MG 24 hr tablet Take 1,500 mg by mouth daily.  03/21/18   [provider]  montelukast (SINGULAIR) 10 MG tablet TAKE 1 TABLET BY MOUTH EVERYDAY AT BEDTIME 07/03/18   Kozlow, Alvira Philips, MD  Multiple Vitamins-Minerals (MULTIVITAMIN WOMEN PO) Take by mouth daily.    [provider]  naproxen (NAPROSYN) 500 MG tablet Take 500 mg by mouth 2 (two) times a day.  04/24/18   [provider]  oxybutynin (DITROPAN-XL) 5 MG 24 hr tablet Take 5 mg  by mouth daily. 04/08/18   [provider]  pravastatin (PRAVACHOL) 20 MG tablet Take 20 mg by mouth daily.  04/19/18   [provider]  propranolol ER (INDERAL LA) 80 MG 24 hr capsule Take 80 mg by mouth daily.  12/03/16   [provider]  triamcinolone (NASACORT ALLERGY 24HR) 55 MCG/ACT AERO nasal inhaler Place 2 sprays into the nose daily.    [provider]    Family History Family History  Problem Relation Age of Onset  . Heart disease Son   . Heart attack Son   . Congestive Heart Failure Mother   . Heart disease Mother   . Heart attack Father   . Diabetes Father   . COPD Father   . Asthma Sister     Social History Social History    Tobacco Use  . Smoking status: Never Smoker  . Smokeless tobacco: Never Used  Substance Use Topics  . Alcohol use: Never    Frequency: Never  . Drug use: Never     Allergies   Patient has no known allergies.   Review of Systems Review of Systems  Constitutional: Negative for chills and fever.  Respiratory: Positive for chest tightness. Negative for cough and shortness of breath.   Cardiovascular: Positive for chest pain and leg swelling. Negative for palpitations.  Gastrointestinal: Negative for abdominal pain, nausea and vomiting.  Genitourinary: Negative for dysuria.  All other systems reviewed and are negative.    Physical Exam Updated Vital Signs BP (!) 152/82 (BP Location: Left Arm)   Pulse 65   Temp 98.2 F (36.8 C) (Oral)   Resp 16   Ht 5\' 8"  (1.727 m)   Wt 122.5 kg   SpO2 97%   BMI 41.05 kg/m   Physical Exam Vitals signs and nursing note reviewed.  Constitutional:      General: She is not in acute distress.    Appearance: Normal appearance. She is well-developed. She is not ill-appearing.  HENT:     Head: Normocephalic and atraumatic.  Eyes:     General: No scleral icterus.       Right eye: No discharge.        Left eye: No discharge.     Conjunctiva/sclera: Conjunctivae normal.     Pupils: Pupils are equal, round, and reactive to light.  Neck:     Musculoskeletal: Normal range of motion.  Cardiovascular:     Rate and Rhythm: Normal rate and regular rhythm.  Pulmonary:     Effort: Pulmonary effort is normal. No respiratory distress.     Breath sounds: Normal breath sounds.  Abdominal:     General: There is no distension.     Palpations: Abdomen is soft.     Tenderness: There is no abdominal tenderness.  Musculoskeletal:     Right lower leg: Edema present.     Left lower leg: Edema present.  Skin:    General: Skin is warm and dry.  Neurological:     Mental Status: She is alert and oriented to person, place, and time.  Psychiatric:         Behavior: Behavior normal.      ED Treatments / Results  Labs (all labs ordered are listed, but only abnormal results are displayed) Labs Reviewed  BASIC METABOLIC PANEL - Abnormal; Notable for the following components:      Result Value   Glucose, Bld 216 (*)    All other components within normal limits  CBC -  Abnormal; Notable for the following components:   WBC 11.7 (*)    All other components within normal limits  BRAIN NATRIURETIC PEPTIDE  I-STAT BETA HCG BLOOD, ED ([facility], [facility], [facility] ONLY)  TROPONIN I (HIGH SENSITIVITY)  TROPONIN I (HIGH SENSITIVITY)    EKG EKG Interpretation  Date/Time:  Friday November 17 2018 10:34:38 EST Ventricular Rate:  69 PR Interval:  188 QRS Duration: 82 QT Interval:  366 QTC Calculation: 392 R Axis:   29 Text Interpretation: Normal sinus rhythm Cannot rule out Anterior infarct , age undetermined Abnormal ECG No significant change since last tracing Confirmed by Wandra Arthurs ([id number]) on 11/17/2018 4:33:55 PM   Radiology Dg Chest 2 View  Result Date: 11/17/2018 CLINICAL DATA:  Chest tightness and headache for 1 week. EXAM: CHEST - 2 VIEW COMPARISON:  None. FINDINGS: The heart size and mediastinal contours are within normal limits. Both lungs are clear. The visualized skeletal structures are unremarkable. IMPRESSION: No active cardiopulmonary disease. Electronically Signed   By: Abelardo Diesel M.D.   On: 11/17/2018 10:56    Procedures Procedures (including critical care time)  Medications Ordered in ED Medications  sodium chloride flush (NS) 0.9 % injection 3 mL (3 mLs Intravenous Not Given 11/17/18 1555)    Initial Impression / Assessment and Plan / ED Course  I have reviewed the triage vital signs and the nursing notes.  Pertinent labs & imaging results that were available during my care of the patient were reviewed by me and considered in my medical decision making (see chart for details).  58 year old female presents with chest pain. She  is mildly hypertensive but otherwise vitals are normal. Work up is reassuring. Pain is atypical sounding but unclear etiology. Doubt ACS, PE, pericarditis, esophageal rupture, tension pneumothorax, aortic dissection, cardiac tamponade. EKG is NSR. CXR is negative. Initial and second troponin is 4. Labs are remarkable for mild leukocytosis and hyperglycemia. She states she got a cortisone injection in her knee yesterday. Low risk Wells score. HEART score is 3. BNP is normal. Advised f/u with cardiology.  Final Clinical Impressions(s) / ED Diagnoses   Final diagnoses:  Atypical chest pain  Peripheral edema    ED Discharge Orders    None       Recardo Evangelist, PA-C 11/17/18 1933    Drenda Freeze, MD 11/17/18 2218

## 2018-11-17 NOTE — ED Notes (Signed)
Pt verbalized understanding of discharge instructions, follow up care reviewed. Pt has no further questions at this time.

## 2018-11-17 NOTE — ED Triage Notes (Signed)
Pt endorses come chest discomfort for a few weeks but started to notice some leg swelling. Endorses HA for everyday for about 20 years. Reports feeling startled a few times last night.

## 2018-11-17 NOTE — Discharge Instructions (Signed)
Please follow up with cardiology Try to avoid caffeine  Return if your symptoms are worsening

## 2018-12-22 ENCOUNTER — Other Ambulatory Visit: Payer: Self-pay | Admitting: Allergy and Immunology

## 2019-01-20 ENCOUNTER — Other Ambulatory Visit: Payer: Self-pay | Admitting: Allergy and Immunology

## 2019-02-18 NOTE — Progress Notes (Signed)
Cardiology Office Note:   Date:  02/20/2019  NAME:  Jennifer Bishop    MRN: [id number] DOB:  [birth year]   PCP:  Noni Saupe, MD  Cardiologist:  No primary care provider on file.   Referring MD: Noni Saupe, MD   Chief Complaint  Patient presents with  . Chest Pain   History of Present Illness:   Jennifer Bishop is a 59 y.o. female with a hx of hypertension, diabetes who is being seen today for the evaluation of chest pain at the request of Noni Saupe, MD. Evaluated in ER 11/17/2018. Troponin negative x 2. BNP 94. EKG NSR 69 bpm. No STT changes to suggest ischemia or prior infarction.   She reports for the last 4 to 5 months she can get an occasional squeezing sensation in her chest.  She reports no identifiable trigger.  The pain lasts seconds and then resolves without any alleviating factor.  She also reports that when she had to climb a recent flight of stairs she did get tightness in her chest that lasted seconds and went away.  It appears that this pain comes and goes.  It was occurring more frequently a few months ago but now she can get it once or twice a month.  She reports that she has decreased her caffeine consumption and noted some improvement.  There is no associated shortness of breath or palpitations with the pain.  The pain does not go into her arm or jaw.  Cardiovascular disease risk factors include diabetes which is well controlled on Metformin.  Most recent A1c 6.1.  She has no kidney disease.  Lipid profile with LDL 94.  She does have a history of heart disease in both her mother and father.  They seem to have had bypass surgery and congestive heart failure.  She is a never smoker.  She does not exercise routinely but with her current level of activity she does not get any exertional chest tightness.  Only the above episode described.  She is overall concerned about her heart.  She would like to make sure that everything is okay with her heart moving  forward.  Creatinine 0.6, triglycerides 117, cholesterol 161, HDL 44, LDL 94, A1c 5.9  Past Medical History: Past Medical History:  Diagnosis Date  . Depression   . Diabetes (HCC)   . Headache   . Hypertension   . Urinary incontinence     Past Surgical History: Past Surgical History:  Procedure Laterality Date  . ENDOVENOUS ABLATION SAPHENOUS VEIN W/ LASER Left 05/03/2017   endovenous laser ablation Left greater saphenous vein by Josephina Gip MD   . KNEE ARTHROSCOPY     1993 and 2015    Current Medications: Current Meds  Medication Sig  . aspirin 81 MG tablet Take 81 mg by mouth daily.  . DULoxetine (CYMBALTA) 30 MG capsule Take 30 mg by mouth daily.  . DULoxetine (CYMBALTA) 60 MG capsule Take 60 mg by mouth daily.  . hydrochlorothiazide (HYDRODIURIL) 12.5 MG tablet TAKE 1 TABLET DAILY  . losartan (COZAAR) 50 MG tablet Take 1 tablet (50 mg total) by mouth daily.  . metFORMIN (GLUCOPHAGE-XR) 500 MG 24 hr tablet Take 1,500 mg by mouth daily.   . montelukast (SINGULAIR) 10 MG tablet TAKE 1 TABLET BY MOUTH EVERYDAY AT BEDTIME  . Multiple Vitamins-Minerals (MULTIVITAMIN WOMEN PO) Take by mouth daily.  . naproxen (NAPROSYN) 500 MG tablet Take 500 mg by mouth 2 (two)  times a day.   . oxybutynin (DITROPAN-XL) 5 MG 24 hr tablet Take 5 mg by mouth daily.  . pravastatin (PRAVACHOL) 20 MG tablet Take 20 mg by mouth daily.   . propranolol ER (INDERAL LA) 80 MG 24 hr capsule Take 80 mg by mouth daily.   Marland Kitchen triamcinolone (NASACORT ALLERGY 24HR) 55 MCG/ACT AERO nasal inhaler Place 2 sprays into the nose daily.     Allergies:    Patient has no known allergies.   Social History: Social History   Socioeconomic History  . Marital status: Married    Spouse name: Not on file  . Number of children: 1  . Years of education: Not on file  . Highest education level: Not on file  Occupational History  . Occupation: customer service  Tobacco Use  . Smoking status: Never Smoker  . Smokeless  tobacco: Never Used  Substance and Sexual Activity  . Alcohol use: Never  . Drug use: Never  . Sexual activity: Not on file  Other Topics Concern  . Not on file  Social History Narrative  . Not on file   Social Determinants of Health   Financial Resource Strain:   . Difficulty of Paying Living Expenses: Not on file  Food Insecurity:   . Worried About Programme researcher, broadcasting/film/video in the Last Year: Not on file  . Ran Out of Food in the Last Year: Not on file  Transportation Needs:   . Lack of Transportation (Medical): Not on file  . Lack of Transportation (Non-Medical): Not on file  Physical Activity:   . Days of Exercise per Week: Not on file  . Minutes of Exercise per Session: Not on file  Stress:   . Feeling of Stress : Not on file  Social Connections:   . Frequency of Communication with Friends and Family: Not on file  . Frequency of Social Gatherings with Friends and Family: Not on file  . Attends Religious Services: Not on file  . Active Member of Clubs or Organizations: Not on file  . Attends Banker Meetings: Not on file  . Marital Status: Not on file     Family History: The patient's family history includes Asthma in her sister; COPD in her father; Congestive Heart Failure in her mother; Diabetes in her father; Heart attack in her father and son; Heart disease in her father, mother, and son.  ROS:   All other ROS reviewed and negative. Pertinent positives noted in the HPI.     EKGs/Labs/Other Studies Reviewed:   The following studies were personally reviewed by me today:  EKG:  EKG is  ordered today.  The ekg ordered today demonstrates sinus bradycardia, heart rate 58, no acute ischemic changes, no evidence of prior infarction, and was personally reviewed by me.   Recent Labs: 11/17/2018: B Natriuretic Peptide 93.8; BUN 15; Creatinine, Ser 0.58; Hemoglobin 14.4; Platelets 346; Potassium 4.4; Sodium 137   Recent Lipid Panel No results found for: CHOL, TRIG,  HDL, CHOLHDL, VLDL, LDLCALC, LDLDIRECT  Physical Exam:   VS:  BP 135/70   Pulse (!) 58   Temp (!) 96.7 F (35.9 C)   Ht 5\' 9"  (1.753 m)   Wt 280 lb 12.8 oz (127.4 kg)   SpO2 95%   BMI 41.47 kg/m    Wt Readings from Last 3 Encounters:  02/20/19 280 lb 12.8 oz (127.4 kg)  11/17/18 270 lb (122.5 kg)  04/27/18 264 lb 12.8 oz (120.1 kg)  General: Well nourished, well developed, in no acute distress Heart: Atraumatic, normal size  Eyes: PEERLA, EOMI  Neck: Supple, no JVD Endocrine: No thryomegaly Cardiac: Normal S1, S2; RRR; no murmurs, rubs, or gallops Lungs: Clear to auscultation bilaterally, no wheezing, rhonchi or rales  Abd: Soft, nontender, no hepatomegaly  Ext: No edema, pulses 2+ Musculoskeletal: No deformities, BUE and BLE strength normal and equal Skin: Warm and dry, no rashes   Neuro: Alert and oriented to person, place, time, and situation, CNII-XII grossly intact, no focal deficits  Psych: Normal mood and affect   ASSESSMENT:   Jennifer Bishop is a 59 y.o. female who presents for the following: 1. Chest pain of uncertain etiology   2. Essential hypertension   3. Mixed hyperlipidemia     PLAN:   1. Chest pain of uncertain etiology -Atypical chest pain.  Does have exertional component at times, but can occur at rest.  Last seconds.  Described as squeezing pain.  EKG today without acute ischemic changes no evidence of prior infarction.  We will proceed with an echocardiogram as well as cardiac CTA to exclude obstructive CAD. -Her main risk factors are diabetes, hypertension and a strong family history. -We will also plan to continue her propranolol which she has been on for years to be taken the day of her scan.  I do not think she needs any special instructions her heart rate is 58 today in office. -She will obtain a BMP 1 week before her scan. -We will plan to see her back in 3 months to go over results and determine if we need to intensify her statin therapy.  2.  Essential hypertension -Acceptable today.  No changes to medications.  3. Mixed hyperlipidemia -Continue pravastatin for now.  May need high intensity statin pending cardiac CTA results.  Disposition: Return in about 3 months (around 05/20/2019).  Medication Adjustments/Labs and Tests Ordered: Current medicines are reviewed at length with the patient today.  Concerns regarding medicines are outlined above.  Orders Placed This Encounter  Procedures  . CT CORONARY MORPH W/CTA COR W/SCORE W/CA W/CM &/OR WO/CM  . CT CORONARY FRACTIONAL FLOW RESERVE DATA PREP  . CT CORONARY FRACTIONAL FLOW RESERVE FLUID ANALYSIS  . Basic metabolic panel  . Basic metabolic panel  . EKG 12-Lead  . ECHOCARDIOGRAM COMPLETE   No orders of the defined types were placed in this encounter.   Patient Instructions  Medication Instructions:  Take your Propranolol like you would normally the day of your CT.   *If you need a refill on your cardiac medications before your next appointment, please call your pharmacy*  Lab Work: BMET one week before CT when scheduled.   If you have labs (blood work) drawn today and your tests are completely normal, you will receive your results only by: Marland Kitchen MyChart Message (if you have MyChart) OR . A paper copy in the mail If you have any lab test that is abnormal or we need to change your treatment, we will call you to review the results.  Testing/Procedures: Echocardiogram - Your physician has requested that you have an echocardiogram. Echocardiography is a painless test that uses sound waves to create images of your heart. It provides your doctor with information about the size and shape of your heart and how well your heart's chambers and valves are working. This procedure takes approximately one hour. There are no restrictions for this procedure. This will be performed at our [facility] location - [address].  Your physician has requested that you have cardiac  CT. Cardiac computed tomography (CT) is a painless test that uses an x-ray machine to take clear, detailed pictures of your heart. For further information please visit [email or web address] Please follow instruction sheet as given.   Follow-Up: At [facility], you and your health needs are our priority.  As part of our continuing mission to provide you with exceptional heart care, we have created designated Provider Care Teams.  These Care Teams include your primary Cardiologist (physician) and Advanced Practice Providers (APPs -  Physician Assistants and Nurse Practitioners) who all work together to provide you with the care you need, when you need it.  Your next appointment:   3 month(s)  The format for your next appointment:   Virtual Visit   Provider:   Lennie Odor, MD  Other Instructions   Your cardiac CT will be scheduled at one of the below locations:   [facility] [address] [place], [place] [address] [phone]  If scheduled at [facility], please arrive at the [facility] main entrance of [facility] 30 minutes prior to test start time. Proceed to the [facility] Radiology Department (first floor) to check-in and test prep.  Please follow these instructions carefully (unless otherwise directed):  Hold all erectile dysfunction medications at least 3 days (72 hrs) prior to test.  On the Night Before the Test: . Be sure to Drink plenty of water. . Do not consume any caffeinated/decaffeinated beverages or chocolate 12 hours prior to your test. . Do not take any antihistamines 12 hours prior to your test. . If you take Metformin do not take 24 hours prior to test.  On the Day of the Test: . Drink plenty of water. Do not drink any water within one hour of the test. . Do not eat any food 4 hours prior to the test. . You may take your regular medications prior to the test.  . Take your propranolol the day of your test. . HOLD  Furosemide/Hydrochlorothiazide morning of the test. . FEMALES- please wear underwire-free bra if available  After the Test: . Drink plenty of water. . After receiving IV contrast, you may experience a mild flushed feeling. This is normal. . On occasion, you may experience a mild rash up to 24 hours after the test. This is not dangerous. If this occurs, you can take Benadryl 25 mg and increase your fluid intake. . If you experience trouble breathing, this can be serious. If it is severe call 911 IMMEDIATELY. If it is mild, please call our office. . If you take any of these medications: Glipizide/Metformin, Avandament, Glucavance, please do not take 48 hours after completing test unless otherwise instructed.   Once we have confirmed authorization from your insurance company, we will call you to set up a date and time for your test.   For non-scheduling related questions, please contact the cardiac imaging nurse navigator should you have any questions/concerns: Rockwell Alexandria, RN Navigator Cardiac Imaging [facility] Heart and Vascular Services [phone] mobile       Signed, Lenna Gilford. Flora Lipps, MD [facility]  [address], [address] [place], [place] [address] [phone]  02/20/2019 9:24 AM

## 2019-02-20 ENCOUNTER — Ambulatory Visit: Payer: Commercial Managed Care - PPO | Admitting: Cardiovascular Disease

## 2019-02-20 ENCOUNTER — Other Ambulatory Visit: Payer: Self-pay

## 2019-02-20 ENCOUNTER — Encounter: Payer: Self-pay | Admitting: Cardiovascular Disease

## 2019-02-20 VITALS — BP 135/70 | HR 58 | Temp 96.7°F | Ht 69.0 in | Wt 280.8 lb

## 2019-02-20 DIAGNOSIS — I10 Essential (primary) hypertension: Secondary | ICD-10-CM

## 2019-02-20 DIAGNOSIS — E782 Mixed hyperlipidemia: Secondary | ICD-10-CM

## 2019-02-20 DIAGNOSIS — R079 Chest pain, unspecified: Secondary | ICD-10-CM | POA: Diagnosis not present

## 2019-02-20 NOTE — Patient Instructions (Signed)
Medication Instructions:  Take your Propranolol like you would normally the day of your CT.   *If you need a refill on your cardiac medications before your next appointment, please call your pharmacy*  Lab Work: BMET one week before CT when scheduled.   If you have labs (blood work) drawn today and your tests are completely normal, you will receive your results only by: Marland Kitchen MyChart Message (if you have MyChart) OR . A paper copy in the mail If you have any lab test that is abnormal or we need to change your treatment, we will call you to review the results.  Testing/Procedures: Echocardiogram - Your physician has requested that you have an echocardiogram. Echocardiography is a painless test that uses sound waves to create images of your heart. It provides your doctor with information about the size and shape of your heart and how well your heart's chambers and valves are working. This procedure takes approximately one hour. There are no restrictions for this procedure. This will be performed at our [facility] location - [address].  Your physician has requested that you have cardiac CT. Cardiac computed tomography (CT) is a painless test that uses an x-ray machine to take clear, detailed pictures of your heart. For further information please visit [email or web address] Please follow instruction sheet as given.   Follow-Up: At [facility], you and your health needs are our priority.  As part of our continuing mission to provide you with exceptional heart care, we have created designated Provider Care Teams.  These Care Teams include your primary Cardiologist (physician) and Advanced Practice Providers (APPs -  Physician Assistants and Nurse Practitioners) who all work together to provide you with the care you need, when you need it.  Your next appointment:   3 month(s)  The format for your next appointment:   Virtual Visit   Provider:   Lennie Odor, MD  Other  Instructions   Your cardiac CT will be scheduled at one of the below locations:   [facility] [address] [place], [place] [address] [phone]  If scheduled at [facility], please arrive at the [facility] main entrance of [facility] 30 minutes prior to test start time. Proceed to the [facility] Radiology Department (first floor) to check-in and test prep.  Please follow these instructions carefully (unless otherwise directed):  Hold all erectile dysfunction medications at least 3 days (72 hrs) prior to test.  On the Night Before the Test: . Be sure to Drink plenty of water. . Do not consume any caffeinated/decaffeinated beverages or chocolate 12 hours prior to your test. . Do not take any antihistamines 12 hours prior to your test. . If you take Metformin do not take 24 hours prior to test.  On the Day of the Test: . Drink plenty of water. Do not drink any water within one hour of the test. . Do not eat any food 4 hours prior to the test. . You may take your regular medications prior to the test.  . Take your propranolol the day of your test. . HOLD Furosemide/Hydrochlorothiazide morning of the test. . FEMALES- please wear underwire-free bra if available  After the Test: . Drink plenty of water. . After receiving IV contrast, you may experience a mild flushed feeling. This is normal. . On occasion, you may experience a mild rash up to 24 hours after the test. This is not dangerous. If this occurs, you can  take Benadryl 25 mg and increase your fluid intake. . If you experience trouble breathing, this can be serious. If it is severe call 911 IMMEDIATELY. If it is mild, please call our office. . If you take any of these medications: Glipizide/Metformin, Avandament, Glucavance, please do not take 48 hours after completing test unless otherwise instructed.   Once we have confirmed authorization from your insurance company, we will call you to  set up a date and time for your test.   For non-scheduling related questions, please contact the cardiac imaging nurse navigator should you have any questions/concerns: Marchia Bond, RN Navigator Cardiac Imaging [facility] [phone] mobile

## 2019-02-24 ENCOUNTER — Other Ambulatory Visit: Payer: Self-pay | Admitting: Allergy and Immunology

## 2019-02-26 NOTE — Telephone Encounter (Signed)
Reviewed patient's last office visit with Dr. Lucie Leather and he wanted patient to follow up with her PCP regarding blood pressure. Medication is pending per patient's call back about following up with her PCP.

## 2019-03-05 ENCOUNTER — Other Ambulatory Visit: Payer: Self-pay

## 2019-03-05 ENCOUNTER — Ambulatory Visit (INDEPENDENT_AMBULATORY_CARE_PROVIDER_SITE_OTHER): Payer: Commercial Managed Care - PPO | Admitting: Allergy and Immunology

## 2019-03-05 ENCOUNTER — Encounter: Payer: Self-pay | Admitting: Allergy and Immunology

## 2019-03-05 VITALS — BP 144/82 | HR 68 | Temp 98.5°F | Resp 16 | Ht 68.3 in | Wt 272.0 lb

## 2019-03-05 DIAGNOSIS — I10 Essential (primary) hypertension: Secondary | ICD-10-CM

## 2019-03-05 DIAGNOSIS — J3089 Other allergic rhinitis: Secondary | ICD-10-CM | POA: Diagnosis not present

## 2019-03-05 DIAGNOSIS — H1010 Acute atopic conjunctivitis, unspecified eye: Secondary | ICD-10-CM

## 2019-03-05 MED ORDER — LOSARTAN POTASSIUM 50 MG PO TABS: 50.0000 mg | ORAL_TABLET | Freq: Every day | ORAL | 0 refills | Status: AC

## 2019-03-05 MED ORDER — MONTELUKAST SODIUM 10 MG PO TABS: ORAL_TABLET | ORAL | 3 refills | Status: DC

## 2019-03-05 NOTE — Progress Notes (Signed)
[place] [facility] [place] [facility] [place] [facility] [place] -    Follow-up Note  Referring Provider: Noni Saupe, MD Primary Provider: Noni Saupe, MD  Date of Office Visit: 03/05/2019  Subjective:   Jennifer Bishop (DOB: 11/21/1960) is a 59 y.o. female who [facility] in re-evaluation of the following:  HPI: [name] returns to this clinic in reevaluation of allergic rhinitis and cough.  Her last visit to this clinic was 18 May 2018 at which point in time she was doing much better regarding her cough since she discontinued her ACE inhibitor.  She continues to do well and really has no cough at all.  She has had very little upper airway symptoms while using montelukast on a consistent basis and occasional antihistamine.  She is now on a combination of therapy for hypertension including losartan and amlodipine.  She has a visit with her primary care doctor in 3 months to follow-up regarding this issue and she is asking for a refill regarding her losartan.  She did receive the flu vaccine.  Allergies as of 03/05/2019   No Known Allergies     Medication List      amLODipine 5 MG tablet Commonly known as: NORVASC Take 5 mg by mouth daily.   DULoxetine 60 MG capsule Commonly known as: CYMBALTA Take 60 mg by mouth daily.   DULoxetine 30 MG capsule Commonly known as: CYMBALTA Take 30 mg by mouth daily.   losartan 50 MG tablet Commonly known as: COZAAR Take 1 tablet (50 mg total) by mouth daily.   metFORMIN 500 MG 24 hr tablet Commonly known as: GLUCOPHAGE-XR Take 1,500 mg by mouth daily.   montelukast 10 MG tablet Commonly known as: SINGULAIR TAKE 1 TABLET BY MOUTH EVERYDAY AT BEDTIME   MULTIVITAMIN WOMEN PO Take by mouth daily.   naproxen 500 MG tablet Commonly known as: NAPROSYN Take 500 mg by mouth 2 (two) times a day.   oxybutynin 5 MG 24 hr tablet Commonly known as: DITROPAN-XL Take 5 mg by mouth daily.    pravastatin 20 MG tablet Commonly known as: PRAVACHOL Take 20 mg by mouth daily.   propranolol ER 80 MG 24 hr capsule Commonly known as: INDERAL LA Take 80 mg by mouth daily.       Past Medical History:  Diagnosis Date  . Depression   . Diabetes (HCC)   . Headache   . Hypertension   . Urinary incontinence     Past Surgical History:  Procedure Laterality Date  . ENDOVENOUS ABLATION SAPHENOUS VEIN W/ LASER Left 05/03/2017   endovenous laser ablation Left greater saphenous vein by Josephina Gip MD   . KNEE ARTHROSCOPY     1993 and 2015    Review of systems negative except as noted in HPI / PMHx or noted below:  Review of Systems  Constitutional: Negative.   HENT: Negative.   Eyes: Negative.   Respiratory: Negative.   Cardiovascular: Negative.   Gastrointestinal: Negative.   Genitourinary: Negative.   Musculoskeletal: Negative.   Skin: Negative.   Neurological: Negative.   Endo/Heme/Allergies: Negative.   Psychiatric/Behavioral: Negative.      Objective:   Vitals:   03/05/19 1717  BP: (!) 144/82  Pulse: 68  Resp: 16  Temp: 98.5 F (36.9 C)  SpO2: 97%   Height: 5' 8.3" (173.5 cm)  Weight: 272 lb (123.4 kg)   Physical Exam Constitutional:      Appearance:  She is not diaphoretic.  HENT:     Head: Normocephalic.     Right Ear: Tympanic membrane, ear canal and external ear normal.     Left Ear: Tympanic membrane, ear canal and external ear normal.     Nose: Nose normal. No mucosal edema or rhinorrhea.     Mouth/Throat:     Pharynx: Uvula midline. No oropharyngeal exudate.  Eyes:     Conjunctiva/sclera: Conjunctivae normal.  Neck:     Thyroid: No thyromegaly.     Trachea: Trachea normal. No tracheal tenderness or tracheal deviation.  Cardiovascular:     Rate and Rhythm: Normal rate and regular rhythm.     Heart sounds: Normal heart sounds, S1 normal and S2 normal. No murmur.  Pulmonary:     Effort: No respiratory distress.     Breath sounds:  Normal breath sounds. No stridor. No wheezing or rales.  Lymphadenopathy:     Head:     Right side of head: No tonsillar adenopathy.     Left side of head: No tonsillar adenopathy.     Cervical: No cervical adenopathy.  Skin:    Findings: No erythema or rash.     Nails: There is no clubbing.  Neurological:     Mental Status: She is alert.     Diagnostics: none  Assessment and Plan:   1. Perennial allergic rhinitis   2. Seasonal allergic conjunctivitis   3. Essential hypertension     1.  Treat and prevent inflammation:   A.  Montelukast 10 mg - 1 tablet daily  2. Treat hypertension:   A.  Losartan 50 mg 1 time per day (3 month prescription)  B.  Amlodipine 5 mg 1 tome per day    3.  If needed:   A.  Nasal saline  B.  OTC Pataday 1 drop each eye daily  C.  OTC antihistamine  4.  Return to clinic if needed: Follow-up with primary care doctor regarding further management of hypertension and refill of montelukast.  5. Obtain Covid vaccine  Mariellen appears to be doing quite well and I do not see a need for her to follow-up in this clinic on a regular basis at this point.  We will refill her montelukast for a year and then she can follow-up with subsequent refills with her primary care doctor and we did refill her losartan until she can visit with her primary care doctor in 3 months..  I will be very happy to see her back in this clinic should there be a problem that develops in the future.  Allena Katz, MD Allergy / Immunology [place]

## 2019-03-05 NOTE — Patient Instructions (Addendum)
  1.  Treat and prevent inflammation:   A.  Montelukast 10 mg - 1 tablet daily  2. Treat hypertension:   A.  Losartan 50 mg 1 time per day (3 month prescription)  B.  Amlodipine 5 mg 1 tome per day    3.  If needed:   A.  Nasal saline  B.  OTC Pataday 1 drop each eye daily  C.  OTC antihistamine  4.  Return to clinic if needed: Follow-up with primary care doctor regarding further management of hypertension and refill of montelukast.  5. Obtain Covid vaccine

## 2019-03-06 ENCOUNTER — Ambulatory Visit (HOSPITAL_COMMUNITY): Payer: Commercial Managed Care - PPO | Attending: Cardiovascular Disease

## 2019-03-06 ENCOUNTER — Other Ambulatory Visit: Payer: Self-pay

## 2019-03-06 ENCOUNTER — Encounter: Payer: Self-pay | Admitting: Allergy and Immunology

## 2019-03-06 DIAGNOSIS — R079 Chest pain, unspecified: Secondary | ICD-10-CM | POA: Insufficient documentation

## 2019-05-08 ENCOUNTER — Other Ambulatory Visit: Payer: Self-pay | Admitting: Allergy and Immunology

## 2019-05-08 NOTE — Telephone Encounter (Signed)
Dr. Lucie Leather please on refilling this medication. I see in your note that you mentioned Losartan and Amlodipine but I did not see mention of this medication.

## 2019-05-08 NOTE — Telephone Encounter (Signed)
Please inform patient that she will need to contact her primary care doctor regarding prescription for hydrochlorothiazide.

## 2019-05-10 ENCOUNTER — Telehealth: Payer: Self-pay

## 2019-05-10 NOTE — Telephone Encounter (Signed)
Left detailed message letting patient know that we had received a refill request for hydrochlorothiazide and per Dr. Lucie Leather, patient will need to contact her primary care physician to handle this RX since it is for blood pressure medicine. I also left our office number on voice message in case patient has any questions.

## 2019-05-10 NOTE — Telephone Encounter (Signed)
Left detailed message on patient's voicemail saying that we had received RX refill for the HCTZ and that patient would need to contact her primary care physician to handle this refill since it is a blood pressure medication.  Told patient to call us if she has any questions.

## 2019-05-16 ENCOUNTER — Other Ambulatory Visit: Payer: Self-pay | Admitting: Allergy and Immunology

## 2019-05-21 ENCOUNTER — Telehealth: Payer: Commercial Managed Care - PPO | Admitting: Cardiovascular Disease

## 2019-05-22 DIAGNOSIS — G8929 Other chronic pain: Secondary | ICD-10-CM | POA: Insufficient documentation

## 2019-05-22 DIAGNOSIS — M25562 Pain in left knee: Secondary | ICD-10-CM

## 2019-05-22 HISTORY — DX: Other chronic pain: G89.29

## 2019-05-22 HISTORY — DX: Pain in left knee: M25.562

## 2020-04-04 ENCOUNTER — Encounter: Payer: Self-pay | Admitting: *Deleted

## 2020-04-04 ENCOUNTER — Encounter: Payer: Self-pay | Admitting: Cardiology

## 2020-04-04 DIAGNOSIS — E119 Type 2 diabetes mellitus without complications: Secondary | ICD-10-CM | POA: Insufficient documentation

## 2020-04-04 DIAGNOSIS — R32 Unspecified urinary incontinence: Secondary | ICD-10-CM | POA: Insufficient documentation

## 2020-04-04 DIAGNOSIS — F32A Depression, unspecified: Secondary | ICD-10-CM | POA: Insufficient documentation

## 2020-04-04 DIAGNOSIS — I10 Essential (primary) hypertension: Secondary | ICD-10-CM | POA: Insufficient documentation

## 2020-04-04 DIAGNOSIS — N950 Postmenopausal bleeding: Secondary | ICD-10-CM

## 2020-04-04 HISTORY — DX: Postmenopausal bleeding: N95.0

## 2020-04-07 ENCOUNTER — Encounter: Payer: Self-pay | Admitting: Cardiology

## 2020-04-07 ENCOUNTER — Other Ambulatory Visit: Payer: Self-pay

## 2020-04-07 ENCOUNTER — Ambulatory Visit: Payer: Managed Care, Other (non HMO) | Admitting: Cardiology

## 2020-04-07 VITALS — BP 134/62 | HR 66 | Ht 70.0 in | Wt 278.0 lb

## 2020-04-07 DIAGNOSIS — E088 Diabetes mellitus due to underlying condition with unspecified complications: Secondary | ICD-10-CM

## 2020-04-07 DIAGNOSIS — R011 Cardiac murmur, unspecified: Secondary | ICD-10-CM

## 2020-04-07 DIAGNOSIS — E782 Mixed hyperlipidemia: Secondary | ICD-10-CM | POA: Insufficient documentation

## 2020-04-07 DIAGNOSIS — I10 Essential (primary) hypertension: Secondary | ICD-10-CM

## 2020-04-07 DIAGNOSIS — Z01810 Encounter for preprocedural cardiovascular examination: Secondary | ICD-10-CM

## 2020-04-07 HISTORY — DX: Diabetes mellitus due to underlying condition with unspecified complications: E08.8

## 2020-04-07 HISTORY — DX: Mixed hyperlipidemia: E78.2

## 2020-04-07 HISTORY — DX: Essential (primary) hypertension: I10

## 2020-04-07 HISTORY — DX: Encounter for preprocedural cardiovascular examination: Z01.810

## 2020-04-07 HISTORY — DX: Cardiac murmur, unspecified: R01.1

## 2020-04-07 NOTE — Progress Notes (Signed)
Cardiology Office Note:    Date:  04/07/2020   ID:  Jennifer Bishop, DOB [birth year], MRN [id number]  PCP:  Noni Saupe, MD  Cardiologist:  Garwin Brothers, MD   Referring MD: Noni Saupe, MD    ASSESSMENT:    1. Essential hypertension   2. Preoperative cardiovascular examination   3. Mixed dyslipidemia   4. Diabetes mellitus due to underlying condition with unspecified complications (HCC)   5. Morbid obesity (HCC)   6. Cardiac murmur    PLAN:    In order of problems listed above:  1. Primary prevention stressed with the patient.  Importance of compliance with diet medication stressed and she vocalized understanding. 2. Preoperative risk stratification: I discussed my findings with the patient in extensive length she has multiple risk factors for coronary artery disease and leads a sedentary lifestyle.  To assess her I will do a Lexiscan sestamibi.  She understands.  I explained the test to her and benefits of stress. 3. Essential hypertension: Blood pressure stable and diet was emphasized. 4. Mixed dyslipidemia diabetes mellitus: Diet emphasized.  Weight reduction stressed.  Her hemoglobin A1c is significantly elevated and I think it would be prudent to optimize it before her knee surgery. 5. Morbid obesity: Risks explained risks of obesity explained.  She plans to do better with lifestyle modification.  I had an extensive discussion with her about this. 6. Cardiac murmur: Echocardiogram will be done to assess murmur heard on auscultation. 7. Patient will be seen in follow-up appointment in 6 months or earlier if the patient has any concerns    Medication Adjustments/Labs and Tests Ordered: Current medicines are reviewed at length with the patient today.  Concerns regarding medicines are outlined above.  Orders Placed This Encounter  Procedures  . MYOCARDIAL PERFUSION IMAGING  . ECHOCARDIOGRAM COMPLETE   No orders of the defined types were placed in this  encounter.    No chief complaint on file.    History of Present Illness:    [name] is a 60 y.o. female.  Patient is planning to undergo knee replacements.  He is here for preop assessment.  She is history of essential hypertension, mixed dyslipidemia, diabetes mellitus and morbid obesity.  She leads a sedentary lifestyle.  She denies any symptoms at this time.  No chest pain orthopnea or PND for obvious reasons she does not exercise much.  At the time of my evaluation, the patient is alert awake oriented and in no distress.  Past Medical History:  Diagnosis Date  . Anxiety state   . Benign essential hypertension   . Bleeding from varicose vein 01/21/2017  . Depression   . Diabetes (HCC)   . G2P2   . Headache   . Migraine   . Morbid obesity (HCC)   . Polyarthralgia   . Postmenopausal bleeding 04/04/2020  . Urinary incontinence   . Varicose veins of left lower extremity with complications 02/01/2017  . Venous (peripheral) insufficiency 01/21/2017    Past Surgical History:  Procedure Laterality Date  . ENDOVENOUS ABLATION SAPHENOUS VEIN W/ LASER Left 05/03/2017   endovenous laser ablation Left greater saphenous vein by Josephina Gip MD   . KNEE ARTHROSCOPY Left    1993 and 2015  . TUBAL LIGATION      Current Medications: Current Meds  Medication Sig  . buPROPion (WELLBUTRIN XL) 150 MG 24 hr tablet Take 150 mg by mouth daily.  . DULoxetine (CYMBALTA) 30 MG capsule Take  30 mg by mouth daily.  . DULoxetine (CYMBALTA) 60 MG capsule Take 60 mg by mouth daily.  Marland Kitchen losartan (COZAAR) 50 MG tablet Take 1 tablet (50 mg total) by mouth daily.  . metFORMIN (GLUCOPHAGE) 500 MG tablet Take 1,000 mg by mouth 2 (two) times daily.  . metFORMIN (GLUCOPHAGE-XR) 500 MG 24 hr tablet Take 500 mg by mouth at bedtime.  . montelukast (SINGULAIR) 10 MG tablet Take 10 mg by mouth at bedtime.  . Multiple Vitamins-Minerals (MULTIVITAMIN WOMEN PO) Take 1 tablet by mouth daily.  . naproxen sodium (ALEVE)  220 MG tablet Take 220 mg by mouth daily as needed (Pain).  Marland Kitchen oxybutynin (DITROPAN-XL) 10 MG 24 hr tablet Take 10 mg by mouth daily.  Marland Kitchen oxybutynin (DITROPAN-XL) 5 MG 24 hr tablet Take 5 mg by mouth daily.  . pravastatin (PRAVACHOL) 20 MG tablet Take 20 mg by mouth daily.   . propranolol ER (INDERAL LA) 80 MG 24 hr capsule Take 80 mg by mouth daily.      Allergies:   Amitriptyline hcl, Lisinopril, and Penicillins   Social History   Socioeconomic History  . Marital status: Married    Spouse name: Not on file  . Number of children: 1  . Years of education: Not on file  . Highest education level: Not on file  Occupational History  . Occupation: customer service  Tobacco Use  . Smoking status: Never Smoker  . Smokeless tobacco: Never Used  Vaping Use  . Vaping Use: Never used  Substance and Sexual Activity  . Alcohol use: Never  . Drug use: Never  . Sexual activity: Not on file  Other Topics Concern  . Not on file  Social History Narrative  . Not on file   Social Determinants of Health   Financial Resource Strain: Not on file  Food Insecurity: Not on file  Transportation Needs: Not on file  Physical Activity: Not on file  Stress: Not on file  Social Connections: Not on file     Family History: The patient's family history includes Asthma in her sister; CAD in her father; COPD in her father; Congestive Heart Failure in her mother; Coronary artery disease in her father; Diabetes in her father; Heart attack in her father and son; Heart disease in her mother and son; Hypertension in her mother.  ROS:   Please see the history of present illness.    All other systems reviewed and are negative.  EKGs/Labs/Other Studies Reviewed:    The following studies were reviewed today: EKG reveals sinus rhythm and nonspecific ST-T changes   Recent Labs: No results found for requested labs within last 8760 hours.  Recent Lipid Panel No results found for: CHOL, TRIG, HDL, CHOLHDL,  VLDL, LDLCALC, LDLDIRECT  Physical Exam:    VS:  BP 134/62   Pulse 66   Ht 5\' 10"  (1.778 m)   Wt 278 lb (126.1 kg)   SpO2 98%   BMI 39.89 kg/m     Wt Readings from Last 3 Encounters:  04/07/20 278 lb (126.1 kg)  01/17/20 279 lb (126.6 kg)  03/05/19 272 lb (123.4 kg)     GEN: Patient is in no acute distress HEENT: Normal NECK: No JVD; No carotid bruits LYMPHATICS: No lymphadenopathy CARDIAC: Hear sounds regular, 2/6 systolic murmur at the apex. RESPIRATORY:  Clear to auscultation without rales, wheezing or rhonchi  ABDOMEN: Soft, non-tender, non-distended MUSCULOSKELETAL:  No edema; No deformity  SKIN: Warm and dry NEUROLOGIC:  Alert and oriented  x 3 PSYCHIATRIC:  Normal affect   Signed, Garwin Brothers, MD  04/07/2020 2:10 PM    [facility]

## 2020-04-07 NOTE — Patient Instructions (Signed)
Medication Instructions:  No medication changes. *If you need a refill on your cardiac medications before your next appointment, please call your pharmacy*   Lab Work: None ordered If you have labs (blood work) drawn today and your tests are completely normal, you will receive your results only by: Marland Kitchen MyChart Message (if you have MyChart) OR . A paper copy in the mail If you have any lab test that is abnormal or we need to change your treatment, we will call you to review the results.   Testing/Procedures: Your physician has requested that you have an echocardiogram. Echocardiography is a painless test that uses sound waves to create images of your heart. It provides your doctor with information about the size and shape of your heart and how well your heart's chambers and valves are working. This procedure takes approximately one hour. There are no restrictions for this procedure.  Your physician has requested that you have a lexiscan myoview. For further information please visit [email or web address] Please follow instruction sheet, as given.  The test will done over 2 days and take approximately 3 to 4 hours each day to complete; you may bring reading material.  If someone comes with you to your appointment, they will need to remain in the main lobby due to limited space in the testing area.   How to prepare for your Myocardial Perfusion Test: . Do not eat or drink 3 hours prior to your test, except you may have water. . Do not consume products containing caffeine (regular or decaffeinated) 12 hours prior to your test. (ex: coffee, chocolate, sodas, tea). . Do bring a list of your current medications with you.  If not listed below, you may take your medications as normal. . Do wear comfortable clothes (no dresses or overalls) and walking shoes, tennis shoes preferred (No heels or open toe shoes are allowed). . Do NOT wear cologne, perfume, aftershave, or lotions (deodorant is allowed). . If  these instructions are not followed, your test will have to be rescheduled.    Follow-Up: At [facility], you and your health needs are our priority.  As part of our continuing mission to provide you with exceptional heart care, we have created designated Provider Care Teams.  These Care Teams include your primary Cardiologist (physician) and Advanced Practice Providers (APPs -  Physician Assistants and Nurse Practitioners) who all work together to provide you with the care you need, when you need it.  We recommend signing up for the patient portal called "MyChart".  Sign up information is provided on this After Visit Summary.  MyChart is used to connect with patients for Virtual Visits (Telemedicine).  Patients are able to view lab/test results, encounter notes, upcoming appointments, etc.  Non-urgent messages can be sent to your provider as well.   To learn more about what you can do with MyChart, go to [email or web address].    Your next appointment:   6 month(s)  The format for your next appointment:   In Person  Provider:   Belva Crome, MD   Other Instructions  Cardiac Nuclear Scan  A cardiac nuclear scan is a test that is done to check the flow of blood to your heart. It is done when you are resting and when you are exercising. The test looks for problems such as:  Not enough blood reaching a portion of the heart.  The heart muscle not working as it should. You may need this test if:  You have heart disease.  You  have had lab results that are not normal.  You have had heart surgery or a balloon procedure to open up blocked arteries (angioplasty).  You have chest pain.  You have shortness of breath. In this test, a special dye (tracer) is put into your bloodstream. The tracer will travel to your heart. A camera will then take pictures of your heart to see how the tracer moves through your heart. This test is usually done at a hospital and takes 2-4 hours. Tell a  doctor about:  Any allergies you have.  All medicines you are taking, including vitamins, herbs, eye drops, creams, and over-the-counter medicines.  Any problems you or family members have had with anesthetic medicines.  Any blood disorders you have.  Any surgeries you have had.  Any medical conditions you have.  Whether you are pregnant or may be pregnant. What are the risks? Generally, this is a safe test. However, problems may occur, such as:  Serious chest pain and heart attack. This is only a risk if the stress portion of the test is done.  Rapid heartbeat.  A feeling of warmth in your chest. This feeling usually does not last long.  Allergic reaction to the tracer. What happens before the test?  Ask your doctor about changing or stopping your normal medicines. This is important.  Follow instructions from your doctor about what you cannot eat or drink.  Remove your jewelry on the day of the test. What happens during the test? 1. An IV tube will be inserted into one of your veins. 2. Your doctor will give you a small amount of tracer through the IV tube. 3. You will wait for 20-40 minutes while the tracer moves through your bloodstream. 4. Your heart will be monitored with an electrocardiogram (ECG). 5. You will lie down on an exam table. 6. Pictures of your heart will be taken for about 15-20 minutes. 7. You may also have a stress test. For this test, one of these things may be done: ? You will be asked to exercise on a treadmill or a stationary bike. ? You will be given medicines that will make your heart work harder. This is done if you are unable to exercise. 8. When blood flow to your heart has peaked, a tracer will again be given through the IV tube. 9. After 20-40 minutes, you will get back on the exam table. More pictures will be taken of your heart. 10. Depending on the tracer that is used, more pictures may need to be taken 3-4 hours later. 11. Your IV tube  will be removed when the test is over. The test may vary among doctors and hospitals. What happens after the test? 1. Ask your doctor: ? Whether you can return to your normal schedule, including diet, activities, and medicines. ? Whether you should drink more fluids. This will help to remove the tracer from your body. Drink enough fluid to keep your pee (urine) pale yellow. 2. Ask your doctor, or the department that is doing the test: ? When will my results be ready? ? How will I get my results? Summary  A cardiac nuclear scan is a test that is done to check the flow of blood to your heart.  Tell your doctor whether you are pregnant or may be pregnant.  Before the test, ask your doctor about changing or stopping your normal medicines. This is important.  Ask your doctor whether you can return to your normal activities. You may  be asked to drink more fluids. This information is not intended to replace advice given to you by your health care provider. Make sure you discuss any questions you have with your health care provider. Document Revised: 04/12/2018 Document Reviewed: 06/06/2017 Elsevier Patient Education  2020 [employer].  Echocardiogram An echocardiogram is a procedure that uses painless sound waves (ultrasound) to produce an image of the heart. Images from an echocardiogram can provide important information about:  Signs of coronary artery disease (CAD).  Aneurysm detection. An aneurysm is a weak or damaged part of an artery wall that bulges out from the normal force of blood pumping through the body.  Heart size and shape. Changes in the size or shape of the heart can be associated with certain conditions, including heart failure, aneurysm, and CAD.  Heart muscle function.  Heart valve function.  Signs of a past heart attack.  Fluid buildup around the heart.  Thickening of the heart muscle.  A tumor or infectious growth around the heart valves. Tell a health care  provider about:  Any allergies you have.  All medicines you are taking, including vitamins, herbs, eye drops, creams, and over-the-counter medicines.  Any blood disorders you have.  Any surgeries you have had.  Any medical conditions you have.  Whether you are pregnant or may be pregnant. What are the risks? Generally, this is a safe procedure. However, problems may occur, including:  Allergic reaction to dye (contrast) that may be used during the procedure. What happens before the procedure? No specific preparation is needed. You may eat and drink normally. What happens during the procedure?    An IV tube may be inserted into one of your veins.  You may receive contrast through this tube. A contrast is an injection that improves the quality of the pictures from your heart.  A gel will be applied to your chest.  A wand-like tool (transducer) will be moved over your chest. The gel will help to transmit the sound waves from the transducer.  The sound waves will harmlessly bounce off of your heart to allow the heart images to be captured in real-time motion. The images will be recorded on a computer. The procedure may vary among health care providers and hospitals. What happens after the procedure?  You may return to your normal, everyday life, including diet, activities, and medicines, unless your health care provider tells you not to do that. Summary  An echocardiogram is a procedure that uses painless sound waves (ultrasound) to produce an image of the heart.  Images from an echocardiogram can provide important information about the size and shape of your heart, heart muscle function, heart valve function, and fluid buildup around your heart.  You do not need to do anything to prepare before this procedure. You may eat and drink normally.  After the echocardiogram is completed, you may return to your normal, everyday life, unless your health care provider tells you not to  do that. This information is not intended to replace advice given to you by your health care provider. Make sure you discuss any questions you have with your health care provider. Document Revised: 04/13/2018 Document Reviewed: 01/24/2016 Elsevier Patient Education  2020 [employer].

## 2020-04-23 ENCOUNTER — Telehealth (HOSPITAL_COMMUNITY): Payer: Self-pay | Admitting: *Deleted

## 2020-04-23 NOTE — Telephone Encounter (Signed)
Left message on voicemail per DPR in reference to upcoming appointment scheduled on 04/29/20 with detailed instructions given per Myocardial Perfusion Study Information Sheet for the test. LM to arrive 15 minutes early, and that it is imperative to arrive on time for appointment to keep from having the test rescheduled. If you need to cancel or reschedule your appointment, please call the office within 24 hours of your appointment. Failure to do so may result in a cancellation of your appointment, and a $50 no show fee. Phone number given for call back for any questions. [name]     

## 2020-04-29 ENCOUNTER — Ambulatory Visit (INDEPENDENT_AMBULATORY_CARE_PROVIDER_SITE_OTHER): Payer: Managed Care, Other (non HMO)

## 2020-04-29 ENCOUNTER — Other Ambulatory Visit: Payer: Self-pay

## 2020-04-29 DIAGNOSIS — E088 Diabetes mellitus due to underlying condition with unspecified complications: Secondary | ICD-10-CM

## 2020-04-29 DIAGNOSIS — Z01810 Encounter for preprocedural cardiovascular examination: Secondary | ICD-10-CM

## 2020-04-29 MED ORDER — REGADENOSON 0.4 MG/5ML IV SOLN
0.4000 mg | Freq: Once | INTRAVENOUS | Status: AC
  Administered 2020-04-29: 0.4 mg via INTRAVENOUS

## 2020-04-29 MED ORDER — TECHNETIUM TC 99M TETROFOSMIN IV KIT
32.8000 | PACK | Freq: Once | INTRAVENOUS | Status: AC | PRN
  Administered 2020-04-29: 32.8 via INTRAVENOUS

## 2020-04-30 ENCOUNTER — Ambulatory Visit (INDEPENDENT_AMBULATORY_CARE_PROVIDER_SITE_OTHER): Payer: Managed Care, Other (non HMO)

## 2020-04-30 ENCOUNTER — Other Ambulatory Visit: Payer: Managed Care, Other (non HMO)

## 2020-04-30 ENCOUNTER — Ambulatory Visit: Payer: Managed Care, Other (non HMO)

## 2020-04-30 DIAGNOSIS — Z01810 Encounter for preprocedural cardiovascular examination: Secondary | ICD-10-CM | POA: Diagnosis not present

## 2020-04-30 DIAGNOSIS — E088 Diabetes mellitus due to underlying condition with unspecified complications: Secondary | ICD-10-CM | POA: Diagnosis not present

## 2020-04-30 LAB — MYOCARDIAL PERFUSION IMAGING
LV dias vol: 100 mL (ref 46–106)
LV sys vol: 37 mL
Peak HR: 77 {beats}/min
Rest HR: 65 {beats}/min
SDS: 3
SRS: 1
SSS: 4
TID: 0.94

## 2020-04-30 LAB — ECHOCARDIOGRAM COMPLETE
Area-P 1/2: 4.46 cm2
S' Lateral: 3.3 cm

## 2020-04-30 MED ORDER — TECHNETIUM TC 99M TETROFOSMIN IV KIT
29.7000 | PACK | Freq: Once | INTRAVENOUS | Status: AC | PRN
  Administered 2020-04-30: 29.7 via INTRAVENOUS

## 2020-04-30 NOTE — Progress Notes (Signed)
Complete echocardiogram performed.  Jimmy Rilee Wendling RDCS, RVT  

## 2020-05-01 ENCOUNTER — Other Ambulatory Visit: Payer: Managed Care, Other (non HMO)

## 2020-05-02 ENCOUNTER — Telehealth: Payer: Self-pay | Admitting: Cardiology

## 2020-05-02 NOTE — Telephone Encounter (Signed)
New message ° ° ° ° ° °Returning a call to the nurse to get test results °

## 2020-05-02 NOTE — Telephone Encounter (Signed)
Results reviewed with pt as per Dr. Krasowski's note.  Pt verbalized understanding and had no additional questions. Routed to PCP  

## 2020-05-13 DIAGNOSIS — I10 Essential (primary) hypertension: Secondary | ICD-10-CM | POA: Insufficient documentation

## 2020-05-13 DIAGNOSIS — Z789 Other specified health status: Secondary | ICD-10-CM | POA: Insufficient documentation

## 2020-05-13 DIAGNOSIS — G43909 Migraine, unspecified, not intractable, without status migrainosus: Secondary | ICD-10-CM | POA: Insufficient documentation

## 2020-05-13 DIAGNOSIS — F411 Generalized anxiety disorder: Secondary | ICD-10-CM | POA: Insufficient documentation

## 2020-05-13 DIAGNOSIS — R519 Headache, unspecified: Secondary | ICD-10-CM | POA: Insufficient documentation

## 2020-05-13 DIAGNOSIS — M2550 Pain in unspecified joint: Secondary | ICD-10-CM | POA: Insufficient documentation

## 2020-05-20 ENCOUNTER — Encounter: Payer: Self-pay | Admitting: Cardiology

## 2020-05-20 ENCOUNTER — Other Ambulatory Visit: Payer: Self-pay

## 2020-05-20 ENCOUNTER — Ambulatory Visit: Payer: Managed Care, Other (non HMO) | Admitting: Cardiology

## 2020-05-20 VITALS — BP 160/88 | HR 64 | Ht 69.0 in | Wt 279.6 lb

## 2020-05-20 DIAGNOSIS — I10 Essential (primary) hypertension: Secondary | ICD-10-CM

## 2020-05-20 DIAGNOSIS — E088 Diabetes mellitus due to underlying condition with unspecified complications: Secondary | ICD-10-CM | POA: Diagnosis not present

## 2020-05-20 DIAGNOSIS — R9439 Abnormal result of other cardiovascular function study: Secondary | ICD-10-CM

## 2020-05-20 DIAGNOSIS — E782 Mixed hyperlipidemia: Secondary | ICD-10-CM | POA: Diagnosis not present

## 2020-05-20 HISTORY — DX: Abnormal result of other cardiovascular function study: R94.39

## 2020-05-20 NOTE — Progress Notes (Signed)
Cardiology Office Note:    Date:  05/20/2020   ID:  Jennifer Bishop, DOB [birth year], MRN [id number]  PCP:  Noni Saupe, MD  Cardiologist:  Garwin Brothers, MD   Referring MD: Noni Saupe, MD    ASSESSMENT:    1. Essential hypertension   2. Mixed dyslipidemia   3. Diabetes mellitus due to underlying condition with unspecified complications (HCC)   4. Morbid obesity (HCC)   5. Abnormal nuclear stress test    PLAN:    In order of problems listed above:  1. Abnormal nuclear stress test: Preop risk assessment: I discussed findings of the stress test with the patient at length.  She is taking a coated baby aspirin on a daily basis.  Overall she is asymptomatic but she leads a sedentary lifestyle and is a diabetic.  In view of the concerning findings I discussed the following with her.I discussed coronary angiography and left heart catheterization with the patient at extensive length. Procedure, benefits and potential risks were explained. Patient had multiple questions which were answered to the patient's satisfaction. Patient agreed and consented for the procedure. Further recommendations will be made based on the findings of the coronary angiography. In the interim. The patient has any significant symptoms he knows to go to the nearest emergency room.  EKG done today reveals sinus rhythm and nonspecific ST-T changes.  In view of the shortage globally of radioactive contrast dye I do not feel very depressed for the need for left ventriculography.  I will leave this final discretion to the interventional cardiologist. 2. Essential hypertension: Blood pressure stable and diet was emphasized.  Lifestyle modification urged.  She is a little stressed today about the above events. 3. Mixed dyslipidemia and diabetes mellitus: Diet was emphasized.  She plans to do better.  Her hemoglobin A1c is greater than 7 and I cautioned her against this. 4. Morbid obesity: Diet emphasized and she  promises to do better. 5. Patient will be seen in follow-up appointment in 6 months or earlier if the patient has any concerns    Medication Adjustments/Labs and Tests Ordered: Current medicines are reviewed at length with the patient today.  Concerns regarding medicines are outlined above.  No orders of the defined types were placed in this encounter.  No orders of the defined types were placed in this encounter.    No chief complaint on file.    History of Present Illness:    Jennifer Bishop is a 60 y.o. female.  Patient has past medical history of essential hypertension, dyslipidemia, diabetes mellitus and morbid obesity.  She is being evaluated for orthopedic surgery.  Stress test has turned to be abnormal.  Details are mentioned below.  Echocardiogram was unremarkable.  She is planning to undergo knee surgery.  Obviously she leads a sedentary lifestyle for this reason.  No chest pain orthopnea or PND.  At the time of my evaluation, the patient is alert awake oriented and in no distress.  Past Medical History:  Diagnosis Date  . Anxiety state   . Benign essential hypertension   . Bilateral chronic knee pain 05/22/2019  . Bleeding from varicose vein 01/21/2017  . Cardiac murmur 04/07/2020  . Depression   . Diabetes (HCC)   . Diabetes mellitus due to underlying condition with unspecified complications (HCC) 04/07/2020  . Essential hypertension 04/07/2020  . G2P2   . Headache   . Hypertension   . Migraine   . Mixed dyslipidemia 04/07/2020  .  Morbid obesity (HCC)   . Polyarthralgia   . Postmenopausal bleeding 04/04/2020  . Preoperative cardiovascular examination 04/07/2020  . Urinary incontinence   . Varicose veins of left lower extremity with complications 02/01/2017  . Venous (peripheral) insufficiency 01/21/2017    Past Surgical History:  Procedure Laterality Date  . ENDOVENOUS ABLATION SAPHENOUS VEIN W/ LASER Left 05/03/2017   endovenous laser ablation Left greater saphenous vein by  Josephina Gip MD   . KNEE ARTHROSCOPY Left    1993 and 2015  . TUBAL LIGATION      Current Medications: Current Meds  Medication Sig  . buPROPion (WELLBUTRIN XL) 150 MG 24 hr tablet Take 150 mg by mouth daily.  . DULoxetine (CYMBALTA) 30 MG capsule Take 30 mg by mouth daily.  . DULoxetine (CYMBALTA) 60 MG capsule Take 60 mg by mouth daily.  Marland Kitchen losartan (COZAAR) 50 MG tablet Take 1 tablet (50 mg total) by mouth daily.  . metFORMIN (GLUCOPHAGE) 500 MG tablet Take 500 mg by mouth 2 (two) times daily.  . montelukast (SINGULAIR) 10 MG tablet Take 10 mg by mouth at bedtime.  . Multiple Vitamins-Minerals (MULTIVITAMIN WOMEN PO) Take 1 tablet by mouth daily.  . naproxen sodium (ALEVE) 220 MG tablet Take 220 mg by mouth daily as needed (Pain).  Marland Kitchen oxybutynin (DITROPAN-XL) 10 MG 24 hr tablet Take 10 mg by mouth daily.  . pravastatin (PRAVACHOL) 20 MG tablet Take 20 mg by mouth daily.   . propranolol ER (INDERAL LA) 80 MG 24 hr capsule Take 80 mg by mouth daily.   Marland Kitchen terbinafine (LAMISIL) 250 MG tablet Take 250 mg by mouth daily.  Marland Kitchen triamcinolone cream (KENALOG) 0.5 % Apply 1 application topically 3 (three) times daily as needed for rash.     Allergies:   Amitriptyline hcl, Lisinopril, and Penicillins   Social History   Socioeconomic History  . Marital status: Married    Spouse name: Not on file  . Number of children: 1  . Years of education: Not on file  . Highest education level: Not on file  Occupational History  . Occupation: customer service  Tobacco Use  . Smoking status: Never Smoker  . Smokeless tobacco: Never Used  Vaping Use  . Vaping Use: Never used  Substance and Sexual Activity  . Alcohol use: Never  . Drug use: Never  . Sexual activity: Not on file  Other Topics Concern  . Not on file  Social History Narrative  . Not on file   Social Determinants of Health   Financial Resource Strain: Not on file  Food Insecurity: Not on file  Transportation Needs: Not on file   Physical Activity: Not on file  Stress: Not on file  Social Connections: Not on file     Family History: The patient's family history includes Asthma in her sister; CAD in her father; COPD in her father; Congestive Heart Failure in her mother; Coronary artery disease in her father; Diabetes in her father; Heart attack in her father and son; Heart disease in her mother and son; Hypertension in her mother.  ROS:   Please see the history of present illness.    All other systems reviewed and are negative.  EKGs/Labs/Other Studies Reviewed:    The following studies were reviewed today: Study Highlights   The left ventricular ejection fraction is normal (55-65%).  Nuclear stress EF: 63%.  There was no ST segment deviation noted during stress.  No T wave inversion was noted during stress.  Defect  1: There is a small reversible defect of mild severity present in the mid anteroseptal location.  Findings consistent with ischemia.  This is a low risk study.     Recent Labs: No results found for requested labs within last 8760 hours.  Recent Lipid Panel No results found for: CHOL, TRIG, HDL, CHOLHDL, VLDL, LDLCALC, LDLDIRECT  Physical Exam:    VS:  BP (!) 160/88   Pulse 64   Ht 5\' 9"  (1.753 m)   Wt 279 lb 9.6 oz (126.8 kg)   SpO2 95%   BMI 41.29 kg/m     Wt Readings from Last 3 Encounters:  05/20/20 279 lb 9.6 oz (126.8 kg)  04/29/20 278 lb (126.1 kg)  04/07/20 278 lb (126.1 kg)     GEN: Patient is in no acute distress HEENT: Normal NECK: No JVD; No carotid bruits LYMPHATICS: No lymphadenopathy CARDIAC: Hear sounds regular, 2/6 systolic murmur at the apex. RESPIRATORY:  Clear to auscultation without rales, wheezing or rhonchi  ABDOMEN: Soft, non-tender, non-distended MUSCULOSKELETAL:  No edema; No deformity  SKIN: Warm and dry NEUROLOGIC:  Alert and oriented x 3 PSYCHIATRIC:  Normal affect   Signed, 06/07/20, MD  05/20/2020 8:27 AM    [place] [facility]

## 2020-05-20 NOTE — Addendum Note (Signed)
Addended by: Eleonore Chiquito on: 05/20/2020 08:59 AM   Modules accepted: Orders

## 2020-05-20 NOTE — Patient Instructions (Signed)
Medication Instructions:  Your physician has recommended you make the following change in your medication:   Use nitroglycerin as needed for chest pain.  *If you need a refill on your cardiac medications before your next appointment, please call your pharmacy*   Lab Work: Your physician recommends that you have a BMET and CBC today in the office for your upcoming procedure.  If you have labs (blood work) drawn today and your tests are completely normal, you will receive your results only by: Marland Kitchen MyChart Message (if you have MyChart) OR . A paper copy in the mail If you have any lab test that is abnormal or we need to change your treatment, we will call you to review the results.   Testing/Procedures:    [facility] AT [place] [address] [place] [address] Dept: [phone] Loc: [phone]  Jennifer Bishop  05/20/2020  You are scheduled for a Cardiac Catheterization on Tuesday, July 5 with Dr. Verdis Prime.  1. Please arrive at the [facility] (Main Entrance A) at [facility]: [address] [place], [place] [address] at 5:30 AM (This time is two hours before your procedure to ensure your preparation). Free valet parking service is available.   Special note: Every effort is made to have your procedure done on time. Please understand that emergencies sometimes delay scheduled procedures.  2. Diet: Do not eat solid foods after midnight.  The patient may have clear liquids until 5am upon the day of the procedure.  3. Labs: You will need to have blood drawn at your pre-cath appointment.  4. Medication instructions in preparation for your procedure:   Contrast Allergy: No  Stop taking, Cozaar (Losartan) Tuesday, July 5,, Aleve or Naprosyn (Naproxen) Monday, July 4,   Do not take Diabetes Med Glucophage (Metformin) on the day of the procedure and HOLD 48 HOURS AFTER THE PROCEDURE.  On the morning of  your procedure, take your Aspirin and any morning medicines NOT listed above.  You may use sips of water.  5. Plan for one night stay--bring personal belongings. 6. Bring a current list of your medications and current insurance cards. 7. You MUST have a responsible person to drive you home. 8. Someone MUST be with you the first 24 hours after you arrive home or your discharge will be delayed. 9. Please wear clothes that are easy to get on and off and wear slip-on shoes.  Thank you for allowing [place] to care for you!   -- [facility]    Follow-Up: At [facility], you and your health needs are our priority.  As part of our continuing mission to provide you with exceptional heart care, we have created designated Provider Care Teams.  These Care Teams include your primary Cardiologist (physician) and Advanced Practice Providers (APPs -  Physician Assistants and Nurse Practitioners) who all work together to provide you with the care you need, when you need it.  We recommend signing up for the patient portal called "MyChart".  Sign up information is provided on this After Visit Summary.  MyChart is used to connect with patients for Virtual Visits (Telemedicine).  Patients are able to view lab/test results, encounter notes, upcoming appointments, etc.  Non-urgent messages can be sent to your provider as well.   To learn more about what you can do with MyChart, go to [email or web address].    Your next appointment:   6 week(s)  The  format for your next appointment:   In Person  Provider:   Belva Cromeajan Revankar, MD   Other Instructions  Coronary Angiogram With Stent Coronary angiogram with stent placement is a procedure to widen or open a narrow blood vessel of the heart (coronary artery). Arteries may become blocked by cholesterol buildup (plaques) in the lining of the artery wall. When a coronary artery becomes partially blocked, blood flow to that area  decreases. This may lead to chest pain or a heart attack (myocardial infarction). A stent is a small piece of metal that looks like mesh or spring. Stent placement may be done as treatment after a heart attack, or to prevent a heart attack if a blocked artery is found by a coronary angiogram. Let your health care provider know about:  Any allergies you have, including allergies to medicines or contrast dye.  All medicines you are taking, including vitamins, herbs, eye drops, creams, and over-the-counter medicines.  Any problems you or family members have had with anesthetic medicines.  Any blood disorders you have.  Any surgeries you have had.  Any medical conditions you have, including kidney problems or kidney failure.  Whether you are pregnant or may be pregnant.  Whether you are breastfeeding. What are the risks? Generally, this is a safe procedure. However, serious problems may occur, including: 1. Damage to nearby structures or organs, such as the heart, blood vessels, or kidneys. 2. A return of blockage. 3. Bleeding, infection, or bruising at the insertion site. 4. A collection of blood under the skin (hematoma) at the insertion site. 5. A blood clot in another part of the body. 6. Allergic reaction to medicines or dyes. 7. Bleeding into the abdomen (retroperitoneal bleeding). 8. Stroke (rare). 9. Heart attack (rare). What happens before the procedure? Staying hydrated Follow instructions from your health care provider about hydration, which may include: 1. Up to 2 hours before the procedure - you may continue to drink clear liquids, such as water, clear fruit juice, black coffee, and plain tea.    Eating and drinking restrictions Follow instructions from your health care provider about eating and drinking, which may include: 1. 8 hours before the procedure - stop eating heavy meals or foods, such as meat, fried foods, or fatty foods. 2. 6 hours before the procedure -  stop eating light meals or foods, such as toast or cereal. 3. 2 hours before the procedure - stop drinking clear liquids. Medicines Ask your health care provider about: 1. Changing or stopping your regular medicines. This is especially important if you are taking diabetes medicines or blood thinners. 2. Taking medicines such as aspirin and ibuprofen. These medicines can thin your blood. Do not take these medicines unless your health care provider tells you to take them. ? Generally, aspirin is recommended before a thin tube, called a catheter, is passed through a blood vessel and inserted into the heart (cardiac catheterization). 3. Taking over-the-counter medicines, vitamins, herbs, and supplements. General instructions 1. Do not use any products that contain nicotine or tobacco for at least 4 weeks before the procedure. These products include cigarettes, e-cigarettes, and chewing tobacco. If you need help quitting, ask your health care provider. 2. Plan to have someone take you home from the hospital or clinic. 3. If you will be going home right after the procedure, plan to have someone with you for 24 hours. 4. You may have tests and imaging procedures. 5. Ask your health care provider: 1. How your insertion site will  be marked. Ask which artery will be used for the procedure. 2. What steps will be taken to help prevent infection. These may include:  Removing hair at the insertion site.  Washing skin with a germ-killing soap.  Taking antibiotic medicine. What happens during the procedure? 1. An IV will be inserted into one of your veins. 2. Electrodes may be placed on your chest to monitor your heart rate during the procedure. 3. You will be given one or more of the following: ? A medicine to help you relax (sedative). ? A medicine to numb the area (local anesthetic) for catheter insertion. 4. A small incision will be made for catheter insertion. 5. The catheter will be inserted into  an artery using a guide wire. The location may be in your groin, your wrist, or the fold of your arm (near your elbow). 6. An X-ray procedure (fluoroscopy) will be used to help guide the catheter to the opening of the heart arteries. 7. A dye will be injected into the catheter. X-rays will be taken. The dye helps to show where any narrowing or blockages are located in the arteries. 8. Tell your health care provider if you have chest pain or trouble breathing. 9. A tiny wire will be guided to the blocked spot, and a balloon will be inflated to make the artery wider. 10. The stent will be expanded to crush the plaques into the wall of the vessel. The stent will hold the area open and improve the blood flow. Most stents have a drug coating to reduce the risk of the stent narrowing over time. 11. The artery may be made wider using a drill, laser, or other tools that remove plaques. 12. The catheter will be removed when the blood flow improves. The stent will stay where it was placed, and the lining of the artery will grow over it. 13. A bandage (dressing) will be placed on the insertion site. Pressure will be applied to stop bleeding. 14. The IV will be removed. This procedure may vary among health care providers and hospitals.    What happens after the procedure?  Your blood pressure, heart rate, breathing rate, and blood oxygen level will be monitored until you leave the hospital or clinic.  If the procedure is done through the leg, you will lie flat in bed for a few hours or for as long as told by your health care provider. You will be instructed not to bend or cross your legs.  The insertion site and the pulse in your foot or wrist will be checked often.  You may have more blood tests, X-rays, and a test that records the electrical activity of your heart (electrocardiogram, or ECG).  Do not drive for 24 hours if you were given a sedative during your procedure. Summary  Coronary angiogram  with stent placement is a procedure to widen or open a narrowed coronary artery. This is done to treat heart problems.  Before the procedure, let your health care provider know about all the medical conditions and surgeries you have or have had.  This is a safe procedure. However, some problems may occur, including damage to nearby structures or organs, bleeding, blood clots, or allergies.  Follow your health care provider's instructions about eating, drinking, medicines, and other lifestyle changes, such as quitting tobacco use before the procedure. This information is not intended to replace advice given to you by your health care provider. Make sure you discuss any questions you have with your  health care provider. Document Revised: 07/12/2018 Document Reviewed: 07/12/2018 Elsevier Patient Education  2021 Elsevier Inc.  Nitroglycerin sublingual tablets What is this medicine? NITROGLYCERIN (nye troe GLI ser in) is a type of vasodilator. It relaxes blood vessels, increasing the blood and oxygen supply to your heart. This medicine is used to relieve chest pain caused by angina. It is also used to prevent chest pain before activities like climbing stairs, going outdoors in cold weather, or sexual activity. This medicine may be used for other purposes; ask your health care provider or pharmacist if you have questions. COMMON BRAND NAME(S): Nitroquick, Nitrostat, Nitrotab What should I tell my health care provider before I take this medicine? They need to know if you have any of these conditions:  anemia  head injury, recent stroke, or bleeding in the brain  liver disease  previous heart attack  an unusual or allergic reaction to nitroglycerin, other medicines, foods, dyes, or preservatives  pregnant or trying to get pregnant  breast-feeding How should I use this medicine? Take this medicine by mouth as needed. Use at the first sign of an angina attack (chest pain or tightness). You  can also take this medicine 5 to 10 minutes before an event likely to produce chest pain. Follow the directions exactly as written on the prescription label. Place one tablet under your tongue and let it dissolve. Do not swallow whole. Replace the dose if you accidentally swallow it. It will help if your mouth is not dry. Saliva around the tablet will help it to dissolve more quickly. Do not eat or drink, smoke or chew tobacco while a tablet is dissolving. Sit down when taking this medicine. In an angina attack, you should feel better within 5 minutes after your first dose. You can take a dose every 5 minutes up to a total of 3 doses. If you do not feel better or feel worse after 1 dose, call 9-1-1 at once. Do not take more than 3 doses in 15 minutes. Your health care provider might give you other directions. Follow those directions if he or she does. Do not take your medicine more often than directed. Talk to your health care provider about the use of this medicine in children. Special care may be needed. Overdosage: If you think you have taken too much of this medicine contact a poison control center or emergency room at once. NOTE: This medicine is only for you. Do not share this medicine with others. What if I miss a dose? This does not apply. This medicine is only used as needed. What may interact with this medicine? Do not take this medicine with any of the following medications:  certain migraine medicines like ergotamine and dihydroergotamine (DHE)  medicines used to treat erectile dysfunction like sildenafil, tadalafil, and vardenafil  riociguat This medicine may also interact with the following medications:  alteplase  aspirin  heparin  medicines for high blood pressure  medicines for mental depression  other medicines used to treat angina  phenothiazines like chlorpromazine, mesoridazine, prochlorperazine, thioridazine This list may not describe all possible interactions.  Give your health care provider a list of all the medicines, herbs, non-prescription drugs, or dietary supplements you use. Also tell them if you smoke, drink alcohol, or use illegal drugs. Some items may interact with your medicine. What should I watch for while using this medicine? Tell your doctor or health care professional if you feel your medicine is no longer working. Keep this medicine with you at  all times. Sit or lie down when you take your medicine to prevent falling if you feel dizzy or faint after using it. Try to remain calm. This will help you to feel better faster. If you feel dizzy, take several deep breaths and lie down with your feet propped up, or bend forward with your head resting between your knees. You may get drowsy or dizzy. Do not drive, use machinery, or do anything that needs mental alertness until you know how this drug affects you. Do not stand or sit up quickly, especially if you are an older patient. This reduces the risk of dizzy or fainting spells. Alcohol can make you more drowsy and dizzy. Avoid alcoholic drinks. Do not treat yourself for coughs, colds, or pain while you are taking this medicine without asking your doctor or health care professional for advice. Some ingredients may increase your blood pressure. What side effects may I notice from receiving this medicine? Side effects that you should report to your doctor or health care professional as soon as possible:  allergic reactions (skin rash, itching or hives; swelling of the face, lips, or tongue)  low blood pressure (dizziness; feeling faint or lightheaded, falls; unusually weak or tired)  low red blood cell counts (trouble breathing; feeling faint; lightheaded, falls; unusually weak or tired) Side effects that usually do not require medical attention (report to your doctor or health care professional if they continue or are bothersome):  facial flushing (redness)  headache  nausea, vomiting This  list may not describe all possible side effects. Call your doctor for medical advice about side effects. You may report side effects to FDA at 1-800-FDA-1088. Where should I keep my medicine? Keep out of the reach of children. Store at room temperature between 20 and 25 degrees C (68 and 77 degrees F). Store in Retail buyer. Protect from light and moisture. Keep tightly closed. Throw away any unused medicine after the expiration date. NOTE: This sheet is a summary. It may not cover all possible information. If you have questions about this medicine, talk to your doctor, pharmacist, or health care provider.  2021 Elsevier/Gold Standard (2017-09-21 16:46:32)  Aspirin and Your Heart Aspirin is a medicine that prevents the platelets in your blood from sticking together. Platelets are the cells that your blood uses for clotting. Aspirin can be used to help reduce the risk of blood clots, heart attacks, and other heart-related problems. What are the risks? Daily use of aspirin can cause side effects. Some of these include:  Bleeding. Bleeding can be minor or serious. An example of minor bleeding is bleeding from a cut, and the bleeding does not stop. An example of more serious bleeding is stomach bleeding or, rarely, bleeding into the brain. Your risk of bleeding increases if you are also taking NSAIDs, such as ibuprofen.  Increased bruising.  Upset stomach.  An allergic reaction. People who have growths inside the nose (nasal polyps) have an increased risk of developing an aspirin allergy. How to use aspirin to care for your heart  Take aspirin only as told by your health care provider. Make sure that you understand how much to take and what form to take. The two forms of aspirin are: ? Non-enteric-coated.This type of aspirin does not have a coating and is absorbed quickly. This type of aspirin also comes in a chewable form. ? Enteric-coated. This type of aspirin has a coating that releases  the medicine very slowly. Enteric-coated aspirin might cause less stomach upset  than non-enteric-coated aspirin. This type of aspirin should not be chewed or crushed.  Work with your health care provider to find out whether it is safe and beneficial for you to take aspirin daily. Taking aspirin daily may be helpful if: ? You have had a heart attack or chest pain, or you are at risk for a heart attack. ? You have a condition in which certain heart vessels are blocked (coronary artery disease), and you have had a procedure to treat it. Examples are:  Open-heart surgery, such as coronary artery bypass surgery (CABG).  Coronary angioplasty,which is done to widen a blood vessel of your heart.  Having a small mesh tube, or stent, placed in your coronary artery. ? You have had certain types of stroke or a mini-stroke known as a transient ischemic attack (TIA). ? You have a narrowing of the arteries that supply the limbs (peripheral artery disease, or PAD). ? You have long-term (chronic) heart rhythm problems, such as atrial fibrillation, and your health care provider thinks aspirin may help. ? You have valve disease or have had surgery on a valve. ? You are considered at increased risk of developing coronary artery disease or PAD.   Follow these instructions at home Medicines  Take over-the-counter and prescription medicines only as told by your health care provider.  If you are taking blood thinners: ? Talk with your health care provider before you take any medicines that contain aspirin or NSAIDs, such as ibuprofen. These medicines increase your risk for dangerous bleeding. ? Take your medicine exactly as told, at the same time every day. ? Avoid activities that could cause injury or bruising, and follow instructions about how to prevent falls. ? Wear a medical alert bracelet or carry a card that lists what medicines you take. General instructions  Do not drink alcohol if: ? Your health care  provider tells you not to drink. ? You are pregnant, may be pregnant, or are planning to become pregnant.  If you drink alcohol: ? Limit how much you use to:  0-1 drink a day for women.  0-2 drinks a day for men. ? Be aware of how much alcohol is in your drink. In the [place] one drink equals one 12 oz bottle of beer (355 mL), one 5 oz glass of wine (148 mL), or one 1 oz glass of hard liquor (44 mL).  Keep all follow-up visits as told by your health care provider. This is important. Where to find more information  The American Heart Association: www.heart.org Contact a health care provider if you have:  Unusual bleeding or bruising.  Stomach pain or nausea.  Ringing in your ears.  An allergic reaction that causes hives, itchy skin, or swelling of the lips, tongue, or face. Get help right away if:  You notice that your bowel movements are bloody, or dark red or black in color.  You vomit or cough up blood.  You have blood in your urine.  You cough, breathe loudly (wheeze), or feel short of breath.  You have chest pain, especially if the pain spreads to your arms, back, neck, or jaw.  You have a headache with confusion. You have any symptoms of a stroke. "BE FAST" is an easy way to remember the main warning signs of a stroke:  B - Balance. Signs are dizziness, sudden trouble walking, or loss of balance.  E - Eyes. Signs are trouble seeing or a sudden change in vision.  F - Face. Signs  are sudden weakness or numbness of the face, or the face or eyelid drooping on one side.  A - Arms. Signs are weakness or numbness in an arm. This happens suddenly and usually on one side of the body.  S - Speech. Signs are sudden trouble speaking, slurred speech, or trouble understanding what people say.  T - Time. Time to call emergency services. Write down what time symptoms started. You have other signs of a stroke, such as:  A sudden, severe headache with no known cause.  Nausea  or vomiting.  Seizure. These symptoms may represent a serious problem that is an emergency. Do not wait to see if the symptoms will go away. Get medical help right away. Call your local emergency services (911 in the [place] Do not drive yourself to the hospital. Summary  Aspirin use can help reduce the risk of blood clots, heart attacks, and other heart-related problems.  Daily use of aspirin can cause side effects.  Take aspirin only as told by your health care provider. Make sure that you understand how much to take and what form to take.  Your health care provider will help you determine whether it is safe and beneficial for you to take aspirin daily. This information is not intended to replace advice given to you by your health care provider. Make sure you discuss any questions you have with your health care provider. Document Revised: 09/25/2018 Document Reviewed: 09/25/2018 Elsevier Patient Education  2021 [employer].

## 2020-07-02 ENCOUNTER — Other Ambulatory Visit: Payer: Self-pay

## 2020-07-02 ENCOUNTER — Encounter: Payer: Self-pay | Admitting: Cardiology

## 2020-07-02 ENCOUNTER — Ambulatory Visit: Payer: Managed Care, Other (non HMO) | Admitting: Cardiology

## 2020-07-02 VITALS — BP 148/86 | HR 95 | Ht 70.0 in | Wt 277.2 lb

## 2020-07-02 DIAGNOSIS — I10 Essential (primary) hypertension: Secondary | ICD-10-CM

## 2020-07-02 DIAGNOSIS — E088 Diabetes mellitus due to underlying condition with unspecified complications: Secondary | ICD-10-CM

## 2020-07-02 DIAGNOSIS — E782 Mixed hyperlipidemia: Secondary | ICD-10-CM

## 2020-07-02 DIAGNOSIS — R9439 Abnormal result of other cardiovascular function study: Secondary | ICD-10-CM

## 2020-07-02 NOTE — H&P (View-Only) (Signed)
Cardiology Office Note:    Date:  07/02/2020   ID:  [name], DOB Jul 07, 1960, MRN [id number]  PCP:  Noni Saupe, MD  Cardiologist:  Garwin Brothers, MD   Referring MD: Noni Saupe, MD    ASSESSMENT:    1. Benign essential hypertension   2. Diabetes mellitus due to underlying condition with unspecified complications (HCC)   3. Mixed dyslipidemia   4. Morbid obesity (HCC)   5. Abnormal nuclear stress test    PLAN:    In order of problems listed above:  I discussed my findings with the patient at length.  EKG done today reveals sinus rhythm and nonspecific ST-T changes. Abnormal nuclear stress test: As mentioned above coronary angiography and left heart catheterization was discussed.  Benefits and potential risks were revisited and she vocalized understanding and questions were answered to her satisfaction. Essential hypertension: Blood pressure stable and diet was emphasized.  She is a little anxious today with all this testing coming up otherwise her blood pressure is fine at home. Mixed dyslipidemia diabetes mellitus and obesity: Secondary Prevention stressed.  I discussed lipids with her.  She will have blood work today as she is fasting and we will recheck her lipids and possibly hemoglobin A1c and sent to primary care doctor. Further recommendations will be made based on the findings of the coronary angiography.   Medication Adjustments/Labs and Tests Ordered: Current medicines are reviewed at length with the patient today.  Concerns regarding medicines are outlined above.  No orders of the defined types were placed in this encounter.  No orders of the defined types were placed in this encounter.    No chief complaint on file.    History of Present Illness:    Jennifer Bishop is a 60 y.o. female.  Patient has past medical history of essential hypertension dyslipidemia diabetes mellitus and morbid obesity.  She denies any problems at this time and takes  care of activities of daily living.  No chest pain orthopnea or PND.  At the time of my evaluation, the patient is alert awake oriented and in no distress.  She has had abnormal stress test and is waiting for coronary angiography.  This has been delayed because of dye shortage.  Past Medical History:  Diagnosis Date   Abnormal nuclear stress test 05/20/2020   Anxiety state    Benign essential hypertension    Bilateral chronic knee pain 05/22/2019   Bleeding from varicose vein 01/21/2017   Cardiac murmur 04/07/2020   Depression    Diabetes (HCC)    Diabetes mellitus due to underlying condition with unspecified complications (HCC) 04/07/2020   Essential hypertension 04/07/2020   G2P2    Headache    Hypertension    Migraine    Mixed dyslipidemia 04/07/2020   Morbid obesity (HCC)    Polyarthralgia    Postmenopausal bleeding 04/04/2020   Preoperative cardiovascular examination 04/07/2020   Urinary incontinence    Varicose veins of left lower extremity with complications 02/01/2017   Venous (peripheral) insufficiency 01/21/2017    Past Surgical History:  Procedure Laterality Date   ENDOVENOUS ABLATION SAPHENOUS VEIN W/ LASER Left 05/03/2017   endovenous laser ablation Left greater saphenous vein by Josephina Gip MD    KNEE ARTHROSCOPY Left    1993 and 2015   TUBAL LIGATION      Current Medications: Current Meds  Medication Sig   amLODipine (NORVASC) 5 MG tablet Take 5 mg by mouth daily.  aspirin EC 81 MG tablet Take 81 mg by mouth every evening. Swallow whole.   buPROPion (WELLBUTRIN XL) 150 MG 24 hr tablet Take 150 mg by mouth daily.   DULoxetine (CYMBALTA) 30 MG capsule Take 30 mg by mouth every morning.   DULoxetine (CYMBALTA) 60 MG capsule Take 60 mg by mouth every morning.   loratadine (CLARITIN) 10 MG tablet Take 10 mg by mouth daily.   losartan (COZAAR) 50 MG tablet Take 1 tablet (50 mg total) by mouth daily.   metFORMIN (GLUCOPHAGE) 500 MG tablet Take 1,000 mg by mouth at bedtime.    montelukast (SINGULAIR) 10 MG tablet Take 10 mg by mouth at bedtime.   Multiple Vitamins-Minerals (PRESERVISION AREDS 2+MULTI VIT PO) Take 1 capsule by mouth daily.   MYRBETRIQ 50 MG TB24 tablet Take 50 mg by mouth daily.   naproxen sodium (ALEVE) 220 MG tablet Take 440 mg by mouth 2 (two) times daily with a meal.   oxybutynin (DITROPAN-XL) 10 MG 24 hr tablet Take 10 mg by mouth at bedtime.   pravastatin (PRAVACHOL) 20 MG tablet Take 20 mg by mouth daily.    propranolol ER (INDERAL LA) 80 MG 24 hr capsule Take 80 mg by mouth daily.      Allergies:   Amitriptyline hcl, Lisinopril, and Penicillins   Social History   Socioeconomic History   Marital status: Married    Spouse name: Not on file   Number of children: 1   Years of education: Not on file   Highest education level: Not on file  Occupational History   Occupation: customer service  Tobacco Use   Smoking status: Never   Smokeless tobacco: Never  Vaping Use   Vaping Use: Never used  Substance and Sexual Activity   Alcohol use: Never   Drug use: Never   Sexual activity: Not on file  Other Topics Concern   Not on file  Social History Narrative   Not on file   Social Determinants of Health   Financial Resource Strain: Not on file  Food Insecurity: Not on file  Transportation Needs: Not on file  Physical Activity: Not on file  Stress: Not on file  Social Connections: Not on file     Family History: The patient's family history includes Asthma in her sister; CAD in her father; COPD in her father; Congestive Heart Failure in her mother; Coronary artery disease in her father; Diabetes in her father; Heart attack in her father and son; Heart disease in her mother and son; Hypertension in her mother.  ROS:   Please see the history of present illness.    All other systems reviewed and are negative.  EKGs/Labs/Other Studies Reviewed:    The following studies were reviewed today: Study Highlights  The left ventricular  ejection fraction is normal (55-65%). Nuclear stress EF: 63%. There was no ST segment deviation noted during stress. No T wave inversion was noted during stress. Defect 1: There is a small reversible defect of mild severity present in the mid anteroseptal location. Findings consistent with ischemia. This is a low risk study.     Recent Labs: No results found for requested labs within last 8760 hours.  Recent Lipid Panel No results found for: CHOL, TRIG, HDL, CHOLHDL, VLDL, LDLCALC, LDLDIRECT  Physical Exam:    VS:  BP (!) 148/86   Pulse 95   Ht 5\' 10"  (1.778 m)   Wt 277 lb 3.2 oz (125.7 kg)   SpO2 96%   BMI  39.77 kg/m     Wt Readings from Last 3 Encounters:  07/02/20 277 lb 3.2 oz (125.7 kg)  05/20/20 279 lb 9.6 oz (126.8 kg)  04/29/20 278 lb (126.1 kg)     GEN: Patient is in no acute distress HEENT: Normal NECK: No JVD; No carotid bruits LYMPHATICS: No lymphadenopathy CARDIAC: Hear sounds regular, 2/6 systolic murmur at the apex. RESPIRATORY:  Clear to auscultation without rales, wheezing or rhonchi  ABDOMEN: Soft, non-tender, non-distended MUSCULOSKELETAL:  No edema; No deformity  SKIN: Warm and dry NEUROLOGIC:  Alert and oriented x 3 PSYCHIATRIC:  Normal affect   Signed, Garwin Brothers, MD  07/02/2020 9:04 AM    [facility]

## 2020-07-02 NOTE — Progress Notes (Signed)
Cardiology Office Note:    Date:  07/02/2020   ID:  Jennifer Bishop, DOB Jul 07, 1960, MRN [id number]  PCP:  Noni Saupe, MD  Cardiologist:  Garwin Brothers, MD   Referring MD: Noni Saupe, MD    ASSESSMENT:    1. Benign essential hypertension   2. Diabetes mellitus due to underlying condition with unspecified complications (HCC)   3. Mixed dyslipidemia   4. Morbid obesity (HCC)   5. Abnormal nuclear stress test    PLAN:    In order of problems listed above:  I discussed my findings with the patient at length.  EKG done today reveals sinus rhythm and nonspecific ST-T changes. Abnormal nuclear stress test: As mentioned above coronary angiography and left heart catheterization was discussed.  Benefits and potential risks were revisited and she vocalized understanding and questions were answered to her satisfaction. Essential hypertension: Blood pressure stable and diet was emphasized.  She is a little anxious today with all this testing coming up otherwise her blood pressure is fine at home. Mixed dyslipidemia diabetes mellitus and obesity: Secondary Prevention stressed.  I discussed lipids with her.  She will have blood work today as she is fasting and we will recheck her lipids and possibly hemoglobin A1c and sent to primary care doctor. Further recommendations will be made based on the findings of the coronary angiography.   Medication Adjustments/Labs and Tests Ordered: Current medicines are reviewed at length with the patient today.  Concerns regarding medicines are outlined above.  No orders of the defined types were placed in this encounter.  No orders of the defined types were placed in this encounter.    No chief complaint on file.    History of Present Illness:    Jennifer Bishop is a 60 y.o. female.  Patient has past medical history of essential hypertension dyslipidemia diabetes mellitus and morbid obesity.  She denies any problems at this time and takes  care of activities of daily living.  No chest pain orthopnea or PND.  At the time of my evaluation, the patient is alert awake oriented and in no distress.  She has had abnormal stress test and is waiting for coronary angiography.  This has been delayed because of dye shortage.  Past Medical History:  Diagnosis Date   Abnormal nuclear stress test 05/20/2020   Anxiety state    Benign essential hypertension    Bilateral chronic knee pain 05/22/2019   Bleeding from varicose vein 01/21/2017   Cardiac murmur 04/07/2020   Depression    Diabetes (HCC)    Diabetes mellitus due to underlying condition with unspecified complications (HCC) 04/07/2020   Essential hypertension 04/07/2020   G2P2    Headache    Hypertension    Migraine    Mixed dyslipidemia 04/07/2020   Morbid obesity (HCC)    Polyarthralgia    Postmenopausal bleeding 04/04/2020   Preoperative cardiovascular examination 04/07/2020   Urinary incontinence    Varicose veins of left lower extremity with complications 02/01/2017   Venous (peripheral) insufficiency 01/21/2017    Past Surgical History:  Procedure Laterality Date   ENDOVENOUS ABLATION SAPHENOUS VEIN W/ LASER Left 05/03/2017   endovenous laser ablation Left greater saphenous vein by Josephina Gip MD    KNEE ARTHROSCOPY Left    1993 and 2015   TUBAL LIGATION      Current Medications: Current Meds  Medication Sig   amLODipine (NORVASC) 5 MG tablet Take 5 mg by mouth daily.  aspirin EC 81 MG tablet Take 81 mg by mouth every evening. Swallow whole.   buPROPion (WELLBUTRIN XL) 150 MG 24 hr tablet Take 150 mg by mouth daily.   DULoxetine (CYMBALTA) 30 MG capsule Take 30 mg by mouth every morning.   DULoxetine (CYMBALTA) 60 MG capsule Take 60 mg by mouth every morning.   loratadine (CLARITIN) 10 MG tablet Take 10 mg by mouth daily.   losartan (COZAAR) 50 MG tablet Take 1 tablet (50 mg total) by mouth daily.   metFORMIN (GLUCOPHAGE) 500 MG tablet Take 1,000 mg by mouth at bedtime.    montelukast (SINGULAIR) 10 MG tablet Take 10 mg by mouth at bedtime.   Multiple Vitamins-Minerals (PRESERVISION AREDS 2+MULTI VIT PO) Take 1 capsule by mouth daily.   MYRBETRIQ 50 MG TB24 tablet Take 50 mg by mouth daily.   naproxen sodium (ALEVE) 220 MG tablet Take 440 mg by mouth 2 (two) times daily with a meal.   oxybutynin (DITROPAN-XL) 10 MG 24 hr tablet Take 10 mg by mouth at bedtime.   pravastatin (PRAVACHOL) 20 MG tablet Take 20 mg by mouth daily.    propranolol ER (INDERAL LA) 80 MG 24 hr capsule Take 80 mg by mouth daily.      Allergies:   Amitriptyline hcl, Lisinopril, and Penicillins   Social History   Socioeconomic History   Marital status: Married    Spouse name: Not on file   Number of children: 1   Years of education: Not on file   Highest education level: Not on file  Occupational History   Occupation: customer service  Tobacco Use   Smoking status: Never   Smokeless tobacco: Never  Vaping Use   Vaping Use: Never used  Substance and Sexual Activity   Alcohol use: Never   Drug use: Never   Sexual activity: Not on file  Other Topics Concern   Not on file  Social History Narrative   Not on file   Social Determinants of Health   Financial Resource Strain: Not on file  Food Insecurity: Not on file  Transportation Needs: Not on file  Physical Activity: Not on file  Stress: Not on file  Social Connections: Not on file     Family History: The patient's family history includes Asthma in her sister; CAD in her father; COPD in her father; Congestive Heart Failure in her mother; Coronary artery disease in her father; Diabetes in her father; Heart attack in her father and son; Heart disease in her mother and son; Hypertension in her mother.  ROS:   Please see the history of present illness.    All other systems reviewed and are negative.  EKGs/Labs/Other Studies Reviewed:    The following studies were reviewed today: Study Highlights  The left ventricular  ejection fraction is normal (55-65%). Nuclear stress EF: 63%. There was no ST segment deviation noted during stress. No T wave inversion was noted during stress. Defect 1: There is a small reversible defect of mild severity present in the mid anteroseptal location. Findings consistent with ischemia. This is a low risk study.     Recent Labs: No results found for requested labs within last 8760 hours.  Recent Lipid Panel No results found for: CHOL, TRIG, HDL, CHOLHDL, VLDL, LDLCALC, LDLDIRECT  Physical Exam:    VS:  BP (!) 148/86   Pulse 95   Ht 5\' 10"  (1.778 m)   Wt 277 lb 3.2 oz (125.7 kg)   SpO2 96%   BMI  39.77 kg/m     Wt Readings from Last 3 Encounters:  07/02/20 277 lb 3.2 oz (125.7 kg)  05/20/20 279 lb 9.6 oz (126.8 kg)  04/29/20 278 lb (126.1 kg)     GEN: Patient is in no acute distress HEENT: Normal NECK: No JVD; No carotid bruits LYMPHATICS: No lymphadenopathy CARDIAC: Hear sounds regular, 2/6 systolic murmur at the apex. RESPIRATORY:  Clear to auscultation without rales, wheezing or rhonchi  ABDOMEN: Soft, non-tender, non-distended MUSCULOSKELETAL:  No edema; No deformity  SKIN: Warm and dry NEUROLOGIC:  Alert and oriented x 3 PSYCHIATRIC:  Normal affect   Signed, Garwin Brothers, MD  07/02/2020 9:04 AM    [facility]

## 2020-07-02 NOTE — Patient Instructions (Signed)
Medication Instructions:  No medication changes. *If you need a refill on your cardiac medications before your next appointment, please call your pharmacy*   Lab Work: None ordered If you have labs (blood work) drawn today and your tests are completely normal, you will receive your results only by: MyChart Message (if you have MyChart) OR A paper copy in the mail If you have any lab test that is abnormal or we need to change your treatment, we will call you to review the results.   Testing/Procedures: None ordered   Follow-Up: At [facility], you and your health needs are our priority.  As part of our continuing mission to provide you with exceptional heart care, we have created designated Provider Care Teams.  These Care Teams include your primary Cardiologist (physician) and Advanced Practice Providers (APPs -  Physician Assistants and Nurse Practitioners) who all work together to provide you with the care you need, when you need it.  We recommend signing up for the patient portal called "MyChart".  Sign up information is provided on this After Visit Summary.  MyChart is used to connect with patients for Virtual Visits (Telemedicine).  Patients are able to view lab/test results, encounter notes, upcoming appointments, etc.  Non-urgent messages can be sent to your provider as well.   To learn more about what you can do with MyChart, go to [email or web address].    Your next appointment:   1 month(s)  The format for your next appointment:   In Person  Provider:   Belva Crome, MD   Other Instructions       [facility] CARDIOVASCULAR DIVISION [facility] AT [facility] [address] [place] [address] Dept: [phone] Loc: [phone]   Jennifer Bishop                 05/20/2020   You are scheduled for a Cardiac Catheterization on Tuesday, July 5 with Dr. Verdis Prime.   1. Please arrive at the [facility] (Main Entrance A) at [facility]: [address] [place], [place] [address] at 5:30 AM (This time is two hours before your procedure to ensure your preparation). Free valet parking service is available.   Special note: Every effort is made to have your procedure done on time. Please understand that emergencies sometimes delay scheduled procedures.   2. Diet: Do not eat solid foods after midnight.  The patient may have clear liquids until 5am upon the day of the procedure.   3. Labs: You will need to have blood drawn at your pre-cath appointment.   4. Medication instructions in preparation for your procedure:    Contrast Allergy: No   Stop taking, Cozaar (Losartan) Tuesday, July 5,, Aleve or Naprosyn (Naproxen) Monday, July 4,     Do not take Diabetes Med Glucophage (Metformin) on the day of the procedure and HOLD 48 HOURS AFTER THE PROCEDURE.   On the morning of your procedure, take your Aspirin and any morning medicines NOT listed above.  You may use sips of water.   5. Plan for one night stay--bring personal belongings. 6. Bring a current list of your medications and current insurance cards. 7. You MUST have a responsible person to drive you home. 8. Someone MUST be with you the first 24 hours after you arrive home or your discharge will be delayed. 9. Please wear clothes that are easy to get on and off and wear slip-on shoes.   Thank you for  allowing [place] to care for you!   -- [facility]

## 2020-07-03 ENCOUNTER — Telehealth: Payer: Self-pay | Admitting: *Deleted

## 2020-07-03 LAB — LIPID PANEL
Chol/HDL Ratio: 3.7 ratio (ref 0.0–4.4)
Cholesterol, Total: 172 mg/dL (ref 100–199)
HDL: 46 mg/dL (ref >39)
LDL Chol Calc (NIH): 100 mg/dL — ABNORMAL HIGH (ref 0–99)
Triglycerides: 147 mg/dL (ref 0–149)
VLDL Cholesterol Cal: 26 mg/dL (ref 5–40)

## 2020-07-03 LAB — CBC WITH DIFFERENTIAL/PLATELET
Basophils Absolute: 0 10*3/uL (ref 0.0–0.2)
Basos: 1 %
EOS (ABSOLUTE): 0.1 10*3/uL (ref 0.0–0.4)
Eos: 1 %
Hematocrit: 39.8 % (ref 34.0–46.6)
Hemoglobin: 13.8 g/dL (ref 11.1–15.9)
Immature Grans (Abs): 0 10*3/uL (ref 0.0–0.1)
Immature Granulocytes: 1 %
Lymphocytes Absolute: 1.4 10*3/uL (ref 0.7–3.1)
Lymphs: 22 %
MCH: 32.6 pg (ref 26.6–33.0)
MCHC: 34.7 g/dL (ref 31.5–35.7)
MCV: 94 fL (ref 79–97)
Monocytes Absolute: 0.6 10*3/uL (ref 0.1–0.9)
Monocytes: 9 %
Neutrophils Absolute: 4.4 10*3/uL (ref 1.4–7.0)
Neutrophils: 66 %
Platelets: 225 10*3/uL (ref 150–450)
RBC: 4.23 x10E6/uL (ref 3.77–5.28)
RDW: 12.3 % (ref 11.7–15.4)
WBC: 6.6 10*3/uL (ref 3.4–10.8)

## 2020-07-03 LAB — BASIC METABOLIC PANEL
BUN/Creatinine Ratio: 18 (ref 12–28)
BUN: 12 mg/dL (ref 8–27)
CO2: 27 mmol/L (ref 20–29)
Calcium: 9.7 mg/dL (ref 8.7–10.3)
Chloride: 97 mmol/L (ref 96–106)
Creatinine, Ser: 0.65 mg/dL (ref 0.57–1.00)
Glucose: 171 mg/dL — ABNORMAL HIGH (ref 65–99)
Potassium: 5.1 mmol/L (ref 3.5–5.2)
Sodium: 137 mmol/L (ref 134–144)
eGFR: 101 mL/min/{1.73_m2} (ref >59)

## 2020-07-03 LAB — HEPATIC FUNCTION PANEL
ALT: 24 IU/L (ref 0–32)
AST: 19 IU/L (ref 0–40)
Albumin: 4.5 g/dL (ref 3.8–4.9)
Alkaline Phosphatase: 116 IU/L (ref 44–121)
Bilirubin Total: 0.5 mg/dL (ref 0.0–1.2)
Bilirubin, Direct: 0.15 mg/dL (ref 0.00–0.40)
Total Protein: 7 g/dL (ref 6.0–8.5)

## 2020-07-03 LAB — TSH: TSH: 2.18 u[IU]/mL (ref 0.450–4.500)

## 2020-07-03 LAB — VITAMIN D 25 HYDROXY (VIT D DEFICIENCY, FRACTURES): Vit D, 25-Hydroxy: 26.1 ng/mL — ABNORMAL LOW (ref 30.0–100.0)

## 2020-07-03 NOTE — Telephone Encounter (Signed)
Reviewed procedure/mask/visitor instructions with patient. 

## 2020-07-03 NOTE — Telephone Encounter (Addendum)
Pt contacted pre-catheterization scheduled at [facility] for: Tuesday July 08, 2020 7:30 AM Verified arrival time and place: [facility] Main Entrance A [facility]) at: 5:30 AM   No solid food after midnight prior to cath, clear liquids until 5 AM day of procedure.  Hold: Metformin-day of procedure and 48 hours post procedure  Except hold medications AM meds can be  taken pre-cath with sips of water including: aspirin 81 mg   Confirmed patient has responsible adult to drive home post procedure and be with patient first 24 hours after arriving home:  You are allowed ONE visitor in the waiting room during the time you are at the hospital for your procedure. Both you and your visitor must wear a mask once you enter the hospital.   Patient reports does not currently have any symptoms concerning for COVID-19 and no household members with COVID-19 like illness.     Reviewed procedure/mask/visitor instructions with patient.

## 2020-07-03 NOTE — Telephone Encounter (Signed)
Patient returning call.

## 2020-07-04 ENCOUNTER — Telehealth: Payer: Self-pay

## 2020-07-04 NOTE — Telephone Encounter (Signed)
Pt has been made aware that her cath will be rescheduled for 07/08/20 until appeal has been completed.

## 2020-07-08 ENCOUNTER — Ambulatory Visit (HOSPITAL_COMMUNITY)
Admission: RE | Admit: 2020-07-08 | Payer: Managed Care, Other (non HMO) | Source: Home / Self Care | Admitting: Interventional Cardiology

## 2020-07-08 ENCOUNTER — Encounter (HOSPITAL_COMMUNITY): Admission: RE | Payer: Self-pay | Source: Home / Self Care

## 2020-07-08 SURGERY — LEFT HEART CATH AND CORONARY ANGIOGRAPHY
Anesthesia: LOCAL

## 2020-07-15 ENCOUNTER — Telehealth: Payer: Self-pay

## 2020-07-15 NOTE — Telephone Encounter (Signed)
Left pt a message to callback with a date to reschedule a cath.

## 2020-07-24 ENCOUNTER — Other Ambulatory Visit: Payer: Self-pay | Admitting: Allergy and Immunology

## 2020-07-24 ENCOUNTER — Telehealth: Payer: Self-pay | Admitting: *Deleted

## 2020-07-24 NOTE — Telephone Encounter (Addendum)
Pt contacted pre-catheterization scheduled at [facility] for: Friday July 25, 2020 7:30 AM Verified arrival time and place: [facility] Main Entrance A [facility]) at: 5:30 AM   No solid food after midnight prior to cath, clear liquids until 5 AM day of procedure.  Hold: Metformin-day of procedure and 48 hours post procedure  Except hold medications AM meds can be  taken pre-cath with sips of water including: aspirin 81 mg   Confirmed patient has responsible adult to drive home post procedure and be with patient first 24 hours after arriving home: yes  You are allowed ONE visitor in the waiting room during the time you are at the hospital for your procedure. Both you and your visitor must wear a mask once you enter the hospital.   Patient reports does not currently have any symptoms concerning for COVID-19 and no household members with COVID-19 like illness.       Reviewed procedure/mask/visitor instructions with patient.

## 2020-07-25 ENCOUNTER — Ambulatory Visit (HOSPITAL_COMMUNITY)
Admission: RE | Admit: 2020-07-25 | Discharge: 2020-07-25 | Disposition: A | Discharge status: Home / Self Care | Payer: Managed Care, Other (non HMO) | Attending: Cardiovascular Disease | Admitting: Cardiovascular Disease

## 2020-07-25 ENCOUNTER — Encounter: Payer: Self-pay | Admitting: *Deleted

## 2020-07-25 ENCOUNTER — Encounter (HOSPITAL_COMMUNITY): Payer: Self-pay | Admitting: Cardiovascular Disease

## 2020-07-25 ENCOUNTER — Other Ambulatory Visit: Payer: Self-pay

## 2020-07-25 ENCOUNTER — Encounter (HOSPITAL_COMMUNITY)
Admission: RE | Disposition: A | Discharge status: Home / Self Care | Payer: Self-pay | Source: Home / Self Care | Attending: Cardiovascular Disease

## 2020-07-25 DIAGNOSIS — Z7982 Long term (current) use of aspirin: Secondary | ICD-10-CM | POA: Insufficient documentation

## 2020-07-25 DIAGNOSIS — Z880 Allergy status to penicillin: Secondary | ICD-10-CM | POA: Diagnosis not present

## 2020-07-25 DIAGNOSIS — Z888 Allergy status to other drugs, medicaments and biological substances status: Secondary | ICD-10-CM | POA: Insufficient documentation

## 2020-07-25 DIAGNOSIS — I2510 Atherosclerotic heart disease of native coronary artery without angina pectoris: Secondary | ICD-10-CM | POA: Diagnosis not present

## 2020-07-25 DIAGNOSIS — Z6839 Body mass index (BMI) 39.0-39.9, adult: Secondary | ICD-10-CM | POA: Insufficient documentation

## 2020-07-25 DIAGNOSIS — E119 Type 2 diabetes mellitus without complications: Secondary | ICD-10-CM | POA: Diagnosis not present

## 2020-07-25 DIAGNOSIS — R9439 Abnormal result of other cardiovascular function study: Secondary | ICD-10-CM | POA: Diagnosis present

## 2020-07-25 DIAGNOSIS — I10 Essential (primary) hypertension: Secondary | ICD-10-CM | POA: Diagnosis not present

## 2020-07-25 DIAGNOSIS — Z79899 Other long term (current) drug therapy: Secondary | ICD-10-CM | POA: Insufficient documentation

## 2020-07-25 DIAGNOSIS — E782 Mixed hyperlipidemia: Secondary | ICD-10-CM | POA: Diagnosis not present

## 2020-07-25 DIAGNOSIS — Z7984 Long term (current) use of oral hypoglycemic drugs: Secondary | ICD-10-CM | POA: Diagnosis not present

## 2020-07-25 DIAGNOSIS — Z006 Encounter for examination for normal comparison and control in clinical research program: Secondary | ICD-10-CM

## 2020-07-25 DIAGNOSIS — E088 Diabetes mellitus due to underlying condition with unspecified complications: Secondary | ICD-10-CM

## 2020-07-25 HISTORY — PX: LEFT HEART CATH AND CORONARY ANGIOGRAPHY: CATH118249

## 2020-07-25 LAB — GLUCOSE, CAPILLARY: Glucose-Capillary: 286 mg/dL — ABNORMAL HIGH (ref 70–99)

## 2020-07-25 SURGERY — LEFT HEART CATH AND CORONARY ANGIOGRAPHY
Anesthesia: LOCAL

## 2020-07-25 MED ORDER — HEPARIN SODIUM (PORCINE) 1000 UNIT/ML IJ SOLN
INTRAMUSCULAR | Status: DC | PRN
  Administered 2020-07-25: 6000 [IU] via INTRAVENOUS

## 2020-07-25 MED ORDER — SODIUM CHLORIDE 0.9% FLUSH
3.0000 mL | INTRAVENOUS | Status: DC | PRN
Start: 2020-07-25 — End: 2020-07-25

## 2020-07-25 MED ORDER — LABETALOL HCL 5 MG/ML IV SOLN: 10.0000 mg | INTRAVENOUS | Status: DC | PRN

## 2020-07-25 MED ORDER — MIDAZOLAM HCL 2 MG/2ML IJ SOLN
INTRAMUSCULAR | Status: DC | PRN
  Administered 2020-07-25: 2 mg via INTRAVENOUS

## 2020-07-25 MED ORDER — SODIUM CHLORIDE 0.9 % IV SOLN: INTRAVENOUS | Status: DC

## 2020-07-25 MED ORDER — SODIUM CHLORIDE 0.9% FLUSH: 3.0000 mL | INTRAVENOUS | Status: DC | PRN

## 2020-07-25 MED ORDER — VERAPAMIL HCL 2.5 MG/ML IV SOLN
INTRAVENOUS | Status: DC | PRN
  Administered 2020-07-25: 10 mL via INTRA_ARTERIAL

## 2020-07-25 MED ORDER — IOHEXOL 350 MG/ML SOLN
INTRAVENOUS | Status: DC | PRN
  Administered 2020-07-25: 60 mL

## 2020-07-25 MED ORDER — SODIUM CHLORIDE 0.9 % WEIGHT BASED INFUSION: 1.0000 mL/kg/h | INTRAVENOUS | Status: DC

## 2020-07-25 MED ORDER — HEPARIN (PORCINE) IN NACL 1000-0.9 UT/500ML-% IV SOLN
INTRAVENOUS | Status: DC | PRN
  Administered 2020-07-25 (×2): 500 mL

## 2020-07-25 MED ORDER — HEPARIN SODIUM (PORCINE) 1000 UNIT/ML IJ SOLN
INTRAMUSCULAR | Status: AC
  Filled 2020-07-25: qty 1

## 2020-07-25 MED ORDER — ASPIRIN 81 MG PO CHEW: 81.0000 mg | CHEWABLE_TABLET | ORAL | Status: DC

## 2020-07-25 MED ORDER — ATORVASTATIN CALCIUM 40 MG PO TABS: 40.0000 mg | ORAL_TABLET | Freq: Every day | ORAL | 1 refills | Status: AC

## 2020-07-25 MED ORDER — ONDANSETRON HCL 4 MG/2ML IJ SOLN: 4.0000 mg | Freq: Four times a day (QID) | INTRAMUSCULAR | Status: DC | PRN

## 2020-07-25 MED ORDER — FENTANYL CITRATE (PF) 100 MCG/2ML IJ SOLN
INTRAMUSCULAR | Status: DC | PRN
  Administered 2020-07-25: 50 ug via INTRAVENOUS

## 2020-07-25 MED ORDER — VERAPAMIL HCL 2.5 MG/ML IV SOLN
INTRAVENOUS | Status: AC
  Filled 2020-07-25: qty 2

## 2020-07-25 MED ORDER — LIDOCAINE HCL (PF) 1 % IJ SOLN
INTRAMUSCULAR | Status: AC
  Filled 2020-07-25: qty 30

## 2020-07-25 MED ORDER — HEPARIN (PORCINE) IN NACL 1000-0.9 UT/500ML-% IV SOLN
INTRAVENOUS | Status: AC
  Filled 2020-07-25: qty 500

## 2020-07-25 MED ORDER — ACETAMINOPHEN 325 MG PO TABS: 650.0000 mg | ORAL_TABLET | ORAL | Status: DC | PRN

## 2020-07-25 MED ORDER — FENTANYL CITRATE (PF) 100 MCG/2ML IJ SOLN
INTRAMUSCULAR | Status: AC
  Filled 2020-07-25: qty 2

## 2020-07-25 MED ORDER — SODIUM CHLORIDE 0.9 % WEIGHT BASED INFUSION
3.0000 mL/kg/h | INTRAVENOUS | Status: AC
  Administered 2020-07-25: 3 mL/kg/h via INTRAVENOUS

## 2020-07-25 MED ORDER — SODIUM CHLORIDE 0.9 % IV SOLN: 250.0000 mL | INTRAVENOUS | Status: DC | PRN

## 2020-07-25 MED ORDER — ATORVASTATIN CALCIUM 40 MG PO TABS
40.0000 mg | ORAL_TABLET | Freq: Every day | ORAL | Status: DC
  Administered 2020-07-25: 40 mg via ORAL
  Filled 2020-07-25: qty 1

## 2020-07-25 MED ORDER — LIDOCAINE HCL (PF) 1 % IJ SOLN
INTRAMUSCULAR | Status: DC | PRN
  Administered 2020-07-25: 2 mL

## 2020-07-25 MED ORDER — SODIUM CHLORIDE 0.9 % IV SOLN
250.0000 mL | INTRAVENOUS | Status: DC | PRN
Start: 2020-07-25 — End: 2020-07-25

## 2020-07-25 MED ORDER — MIDAZOLAM HCL 2 MG/2ML IJ SOLN
INTRAMUSCULAR | Status: AC
  Filled 2020-07-25: qty 2

## 2020-07-25 MED ORDER — SODIUM CHLORIDE 0.9% FLUSH: 3.0000 mL | Freq: Two times a day (BID) | INTRAVENOUS | Status: DC

## 2020-07-25 MED ORDER — SODIUM CHLORIDE 0.9% FLUSH
3.0000 mL | Freq: Two times a day (BID) | INTRAVENOUS | Status: DC
Start: 2020-07-25 — End: 2020-07-25

## 2020-07-25 MED ORDER — ASPIRIN 81 MG PO CHEW: 81.0000 mg | CHEWABLE_TABLET | Freq: Every day | ORAL | Status: DC

## 2020-07-25 MED ORDER — HYDRALAZINE HCL 20 MG/ML IJ SOLN: 10.0000 mg | INTRAMUSCULAR | Status: DC | PRN

## 2020-07-25 SURGICAL SUPPLY — 11 items
CATH INFINITI JR4 5F (CATHETERS) ×2 IMPLANT
CATH OPTITORQUE TIG 4.0 5F (CATHETERS) ×2 IMPLANT
DEVICE RAD COMP TR BAND LRG (VASCULAR PRODUCTS) ×2 IMPLANT
GLIDESHEATH SLEND SS 6F .021 (SHEATH) ×2 IMPLANT
GUIDEWIRE INQWIRE 1.5J.035X260 (WIRE) ×1 IMPLANT
INQWIRE 1.5J .035X260CM (WIRE) ×2
KIT HEART LEFT (KITS) ×2 IMPLANT
PACK CARDIAC CATHETERIZATION (CUSTOM PROCEDURE TRAY) ×2 IMPLANT
SHEATH PROBE COVER 6X72 (BAG) ×2 IMPLANT
TRANSDUCER W/STOPCOCK (MISCELLANEOUS) ×2 IMPLANT
TUBING CIL FLEX 10 FLL-RA (TUBING) ×2 IMPLANT

## 2020-07-25 NOTE — Research (Signed)
Identify Informed Consent   Subject Name: Jennifer Bishop  Subject met inclusion and exclusion criteria.  The informed consent form, study requirements and expectations were reviewed with the subject and questions and concerns were addressed prior to the signing of the consent form.  The subject verbalized understanding of the trial requirements.  The subject agreed to participate in the Identify trial and signed the informed consent at 0655 on 25-Jul-2020.  The informed consent was obtained prior to performance of any protocol-specific procedures for the subject.  A copy of the signed informed consent was given to the subject and a copy was placed in the subject's medical record.   Jasmine Pang, RN BSN [facility] Cardiovascular Research & Education Direct Line: [phone]

## 2020-07-25 NOTE — Interval H&P Note (Signed)
Cath Lab Visit (complete for each Cath Lab visit)  Clinical Evaluation Leading to the Procedure:   ACS: No.  Non-ACS:    Anginal Classification: CCS I  Anti-ischemic medical therapy: Maximal Therapy (2 or more classes of medications)  Non-Invasive Test Results: Low-risk stress test findings: cardiac mortality <1%/year  Prior CABG: No previous CABG      History and Physical Interval Note:  07/25/2020 7:44 AM  Jennifer Bishop  has presented today for surgery, with the diagnosis of abnormal stress test.  The various methods of treatment have been discussed with the patient and family. After consideration of risks, benefits and other options for treatment, the patient has consented to  Procedure(s): LEFT HEART CATH AND CORONARY ANGIOGRAPHY (N/A) as a surgical intervention.  The patient's history has been reviewed, patient examined, no change in status, stable for surgery.  I have reviewed the patient's chart and labs.  Questions were answered to the patient's satisfaction.     Nicki Guadalajara

## 2020-08-12 ENCOUNTER — Other Ambulatory Visit: Payer: Self-pay

## 2020-08-12 ENCOUNTER — Ambulatory Visit: Payer: Managed Care, Other (non HMO) | Admitting: Cardiology

## 2020-08-12 ENCOUNTER — Encounter: Payer: Self-pay | Admitting: Cardiology

## 2020-08-12 VITALS — BP 128/68 | HR 74 | Ht 69.0 in | Wt 277.2 lb

## 2020-08-12 DIAGNOSIS — E782 Mixed hyperlipidemia: Secondary | ICD-10-CM | POA: Diagnosis not present

## 2020-08-12 DIAGNOSIS — I10 Essential (primary) hypertension: Secondary | ICD-10-CM | POA: Diagnosis not present

## 2020-08-12 DIAGNOSIS — E088 Diabetes mellitus due to underlying condition with unspecified complications: Secondary | ICD-10-CM | POA: Diagnosis not present

## 2020-08-12 DIAGNOSIS — I2510 Atherosclerotic heart disease of native coronary artery without angina pectoris: Secondary | ICD-10-CM | POA: Insufficient documentation

## 2020-08-12 NOTE — Progress Notes (Signed)
Cardiology Office Note:    Date:  08/12/2020   ID:  Jennifer Bishop, DOB 1960/05/15, MRN [id number]  PCP:  Noni Saupe, MD  Cardiologist:  Garwin Brothers, MD   Referring MD: Noni Saupe, MD    ASSESSMENT:    1. Benign essential hypertension   2. Coronary artery disease involving native coronary artery of native heart without angina pectoris   3. Primary hypertension   4. Diabetes mellitus due to underlying condition with unspecified complications (HCC)   5. Mixed dyslipidemia   6. Morbid obesity (HCC)    PLAN:    In order of problems listed above:  Coronary artery disease: Nonobstructive in nature: Secondary prevention stressed with the patient.  Importance of compliance with diet medication stressed and she vocalized understanding. Preop cardiovascular evaluation: In view of nonobstructive disease secondary prevention stressed.  Also based on this she is not at high risk for coronary events during the aforementioned surgery.  Medical hemodynamic monitoring will further reduce the risk of coronary events. Essential hypertension: Blood pressure stable and diet was emphasized. Mixed dyslipidemia: Lipids were reviewed.  I agree with recommendations for cardiac.  She will be back in 1 month for liver lipid check.  Diet was emphasized. Morbid obesity: Weight reduction was stressed lifestyle modification was urged and she promises to do better. Should be back in 1 month for liver lipid check.Patient will be seen in follow-up appointment in 6 months or earlier if the patient has any concerns    Medication Adjustments/Labs and Tests Ordered: Current medicines are reviewed at length with the patient today.  Concerns regarding medicines are outlined above.  Orders Placed This Encounter  Procedures   Basic metabolic panel   Hepatic function panel   Lipid panel    No orders of the defined types were placed in this encounter.    No chief complaint on file.    History  of Present Illness:    Jennifer Bishop is a 60 y.o. female.  Patient has past medical history of essential hypertension, dyslipidemia, diabetes mellitus, morbid obesity and she underwent coronary angiography which revealed nonobstructive disease.  Details are mentioned below.  She denies any chest pain orthopnea or PND.  She leads a sedentary lifestyle overall because of orthopedic problems affected her knee.  At the time of my evaluation, the patient is alert awake oriented and in no distress.  Past Medical History:  Diagnosis Date   Abnormal nuclear stress test 05/20/2020   Anxiety state    Benign essential hypertension    Bilateral chronic knee pain 05/22/2019   Bleeding from varicose vein 01/21/2017   Cardiac murmur 04/07/2020   Depression    Diabetes (HCC)    Diabetes mellitus due to underlying condition with unspecified complications (HCC) 04/07/2020   Essential hypertension 04/07/2020   G2P2    Headache    Hypertension    Migraine    Mixed dyslipidemia 04/07/2020   Morbid obesity (HCC)    Polyarthralgia    Postmenopausal bleeding 04/04/2020   Preoperative cardiovascular examination 04/07/2020   Urinary incontinence    Varicose veins of left lower extremity with complications 02/01/2017   Venous (peripheral) insufficiency 01/21/2017    Past Surgical History:  Procedure Laterality Date   ENDOVENOUS ABLATION SAPHENOUS VEIN W/ LASER Left 05/03/2017   endovenous laser ablation Left greater saphenous vein by Josephina Gip MD    KNEE ARTHROSCOPY Left    1993 and 2015   LEFT HEART CATH AND  CORONARY ANGIOGRAPHY N/A 07/25/2020   Procedure: LEFT HEART CATH AND CORONARY ANGIOGRAPHY;  Surgeon: Lennette Bihari, MD;  Location: [facility];  Service: Cardiovascular;  Laterality: N/A;   TUBAL LIGATION      Current Medications: Current Meds  Medication Sig   amLODipine (NORVASC) 5 MG tablet Take 5 mg by mouth daily.   aspirin EC 81 MG tablet Take 81 mg by mouth every evening. Swallow whole.    atorvastatin (LIPITOR) 40 MG tablet Take 1 tablet (40 mg total) by mouth daily.   buPROPion (WELLBUTRIN XL) 150 MG 24 hr tablet Take 150 mg by mouth daily.   DULoxetine (CYMBALTA) 30 MG capsule Take 30 mg by mouth daily. Take with 60 mg for a total of 90 mg daily   DULoxetine (CYMBALTA) 60 MG capsule Take 60 mg by mouth every morning. Take with 30 mg for a total of 90 mg daily   loratadine (CLARITIN) 10 MG tablet Take 10 mg by mouth daily.   losartan (COZAAR) 50 MG tablet Take 1 tablet (50 mg total) by mouth daily.   metFORMIN (GLUCOPHAGE) 500 MG tablet Take 1,000 mg by mouth at bedtime.   montelukast (SINGULAIR) 10 MG tablet Take 10 mg by mouth at bedtime.   MYRBETRIQ 50 MG TB24 tablet Take 50 mg by mouth at bedtime.   naproxen sodium (ALEVE) 220 MG tablet Take 440 mg by mouth 2 (two) times daily with a meal.   omeprazole (PRILOSEC) 20 MG capsule Take 20 mg by mouth daily as needed (indigestion).   oxybutynin (DITROPAN-XL) 10 MG 24 hr tablet Take 10 mg by mouth at bedtime.   propranolol ER (INDERAL LA) 80 MG 24 hr capsule Take 80 mg by mouth daily.      Allergies:   Amitriptyline hcl, Lisinopril, and Penicillins   Social History   Socioeconomic History   Marital status: Married    Spouse name: Not on file   Number of children: 1   Years of education: Not on file   Highest education level: Not on file  Occupational History   Occupation: customer service  Tobacco Use   Smoking status: Never   Smokeless tobacco: Never  Vaping Use   Vaping Use: Never used  Substance and Sexual Activity   Alcohol use: Never   Drug use: Never   Sexual activity: Not on file  Other Topics Concern   Not on file  Social History Narrative   Not on file   Social Determinants of Health   Financial Resource Strain: Not on file  Food Insecurity: Not on file  Transportation Needs: Not on file  Physical Activity: Not on file  Stress: Not on file  Social Connections: Not on file     Family  History: The patient's family history includes Asthma in her sister; CAD in her father; COPD in her father; Congestive Heart Failure in her mother; Coronary artery disease in her father; Diabetes in her father; Heart attack in her father and son; Heart disease in her mother and son; Hypertension in her mother.  ROS:   Please see the history of present illness.    All other systems reviewed and are negative.  EKGs/Labs/Other Studies Reviewed:    The following studies were reviewed today: LEFT HEART CATH AND CORONARY ANGIOGRAPHY    Conclusion      Mid RCA lesion is 30% stenosed.   Prox Cx lesion is 10% stenosed.   Mid LAD lesion is 50% stenosed.   There is  evidence for mild coronary calcification.  The LAD has 50% stenosis in the midportion of the vessel.  There is mild calcification with narrowing of 10% in the proximal circumflex.  The RCA is a dominant vessel with mild luminal irregularity and 30%  stenosis in a bend in the mid vessel.   Normal LV contractility with EF estimated 55% without focal segmental wall motion abnormalities.  LVEDP in the range of 20 to 25% millimeters mercury.   RECOMMENDATION: Initial medical therapy.  The patient is in need of knee replacement surgery.  Consider titration of medications with possible increase dose of amlodipine or beta-blocker therapy both for more optimal blood pressure control and anti-ischemic benefit.  Consider changing from low-dose pravastatin to atorvastatin or rosuvastatin with target LDL in the 60s or below for more aggressive lipid therapy.   IMPRESSIONS     1. Left ventricular ejection fraction, by estimation, is 55 to 60%. The  left ventricle has normal function. The left ventricle has no regional  wall motion abnormalities. There is mild concentric left ventricular  hypertrophy. Left ventricular diastolic  parameters are consistent with Grade I diastolic dysfunction (impaired  relaxation). The average left ventricular  global longitudinal strain is  -11.8 %. The global longitudinal strain is abnormal.   2. Right ventricular systolic function is normal. The right ventricular  size is normal. There is normal pulmonary artery systolic pressure.   3. The mitral valve is normal in structure. No evidence of mitral valve  regurgitation. No evidence of mitral stenosis.   4. The aortic valve is normal in structure. Aortic valve regurgitation is  not visualized. No aortic stenosis is present.   5. The inferior vena cava is normal in size with greater than 50%  respiratory variability, suggesting right atrial pressure of 3 mmHg.      Recent Labs: 07/02/2020: ALT 24; BUN 12; Creatinine, Ser 0.65; Hemoglobin 13.8; Platelets 225; Potassium 5.1; Sodium 137; TSH 2.180  Recent Lipid Panel    Component Value Date/Time   CHOL 172 07/02/2020 0919   TRIG 147 07/02/2020 0919   HDL 46 07/02/2020 0919   CHOLHDL 3.7 07/02/2020 0919   LDLCALC 100 (H) 07/02/2020 0919    Physical Exam:    VS:  BP 128/68   Pulse 74   Ht 5\' 9"  (1.753 m)   Wt 277 lb 3.2 oz (125.7 kg)   LMP  (LMP Unknown) Comment: post menopause  SpO2 95%   BMI 40.94 kg/m     Wt Readings from Last 3 Encounters:  08/12/20 277 lb 3.2 oz (125.7 kg)  07/25/20 275 lb (124.7 kg)  07/02/20 277 lb 3.2 oz (125.7 kg)     GEN: Patient is in no acute distress HEENT: Normal NECK: No JVD; No carotid bruits LYMPHATICS: No lymphadenopathy CARDIAC: Hear sounds regular, 2/6 systolic murmur at the apex. RESPIRATORY:  Clear to auscultation without rales, wheezing or rhonchi  ABDOMEN: Soft, non-tender, non-distended MUSCULOSKELETAL:  No edema; No deformity  SKIN: Warm and dry NEUROLOGIC:  Alert and oriented x 3 PSYCHIATRIC:  Normal affect   Signed, 07/04/20, MD  08/12/2020 3:04 PM    [facility]

## 2020-08-12 NOTE — Patient Instructions (Signed)
Medication Instructions:  No medication changes. *If you need a refill on your cardiac medications before your next appointment, please call your pharmacy*   Lab Work: Your physician recommends that you return for lab work in: 1 month FU You need to have labs done when you are fasting.  You can come Monday through Friday 8:30 am to 12:00 pm and 1:15 to 4:30. You do not need to make an appointment as the order has already been placed. The labs you are going to have done are BMET, LFT and Lipids.  If you have labs (blood work) drawn today and your tests are completely normal, you will receive your results only by: MyChart Message (if you have MyChart) OR A paper copy in the mail If you have any lab test that is abnormal or we need to change your treatment, we will call you to review the results.   Testing/Procedures: None ordered   Follow-Up: At [facility], you and your health needs are our priority.  As part of our continuing mission to provide you with exceptional heart care, we have created designated Provider Care Teams.  These Care Teams include your primary Cardiologist (physician) and Advanced Practice Providers (APPs -  Physician Assistants and Nurse Practitioners) who all work together to provide you with the care you need, when you need it.  We recommend signing up for the patient portal called "MyChart".  Sign up information is provided on this After Visit Summary.  MyChart is used to connect with patients for Virtual Visits (Telemedicine).  Patients are able to view lab/test results, encounter notes, upcoming appointments, etc.  Non-urgent messages can be sent to your provider as well.   To learn more about what you can do with MyChart, go to [email or web address].    Your next appointment:   6 month(s)  The format for your next appointment:   In Person  Provider:   Belva Crome, MD   Other Instructions NA

## 2020-09-10 ENCOUNTER — Telehealth: Payer: Self-pay

## 2020-09-10 NOTE — Telephone Encounter (Signed)
I have faxed primary cardiologist note from 08/10/20 that contains clearance.

## 2020-09-10 NOTE — Telephone Encounter (Signed)
   [facility] Pre-operative Risk Assessment    Patient Name: Jennifer Bishop  DOB: 1960/09/20 MRN: [id number]  HEARTCARE STAFF:  - IMPORTANT!!!!!! Under Visit Info/Reason for Call, type in Other and utilize the format Clearance MM/DD/YY or Clearance TBD. Do not use dashes or single digits. - Please review there is not already an duplicate clearance open for this procedure. - If request is for dental extraction, please clarify the # of teeth to be extracted. - If the patient is currently at the dentist's office, call Pre-Op Callback Staff (MA/nurse) to input urgent request.  - If the patient is not currently in the dentist office, please route to the Pre-Op pool.  Request for surgical clearance:  What type of surgery is being performed? Left total knee replacement  When is this surgery scheduled? TBD  What type of clearance is required (medical clearance vs. Pharmacy clearance to hold med vs. Both)? Medical  Are there any medications that need to be held prior to surgery and how long?   Practice name and name of physician performing surgery? [facility]-   What is the office phone number? [phone]   7.   What is the office fax number? [phone]  8.   Anesthesia type (None, local, MAC, general) ?    Lowella Grip 09/10/2020, 1:09 PM  _________________________________________________________________   (provider comments below)

## 2021-05-25 IMAGING — DX DG CHEST 2V
2 series · 2 of 2 positions shown · non-contrast
Comparison: None.

CLINICAL DATA: Chest tightness and headache for 1 week.

EXAM:
CHEST - 2 VIEW

[w chest pa]
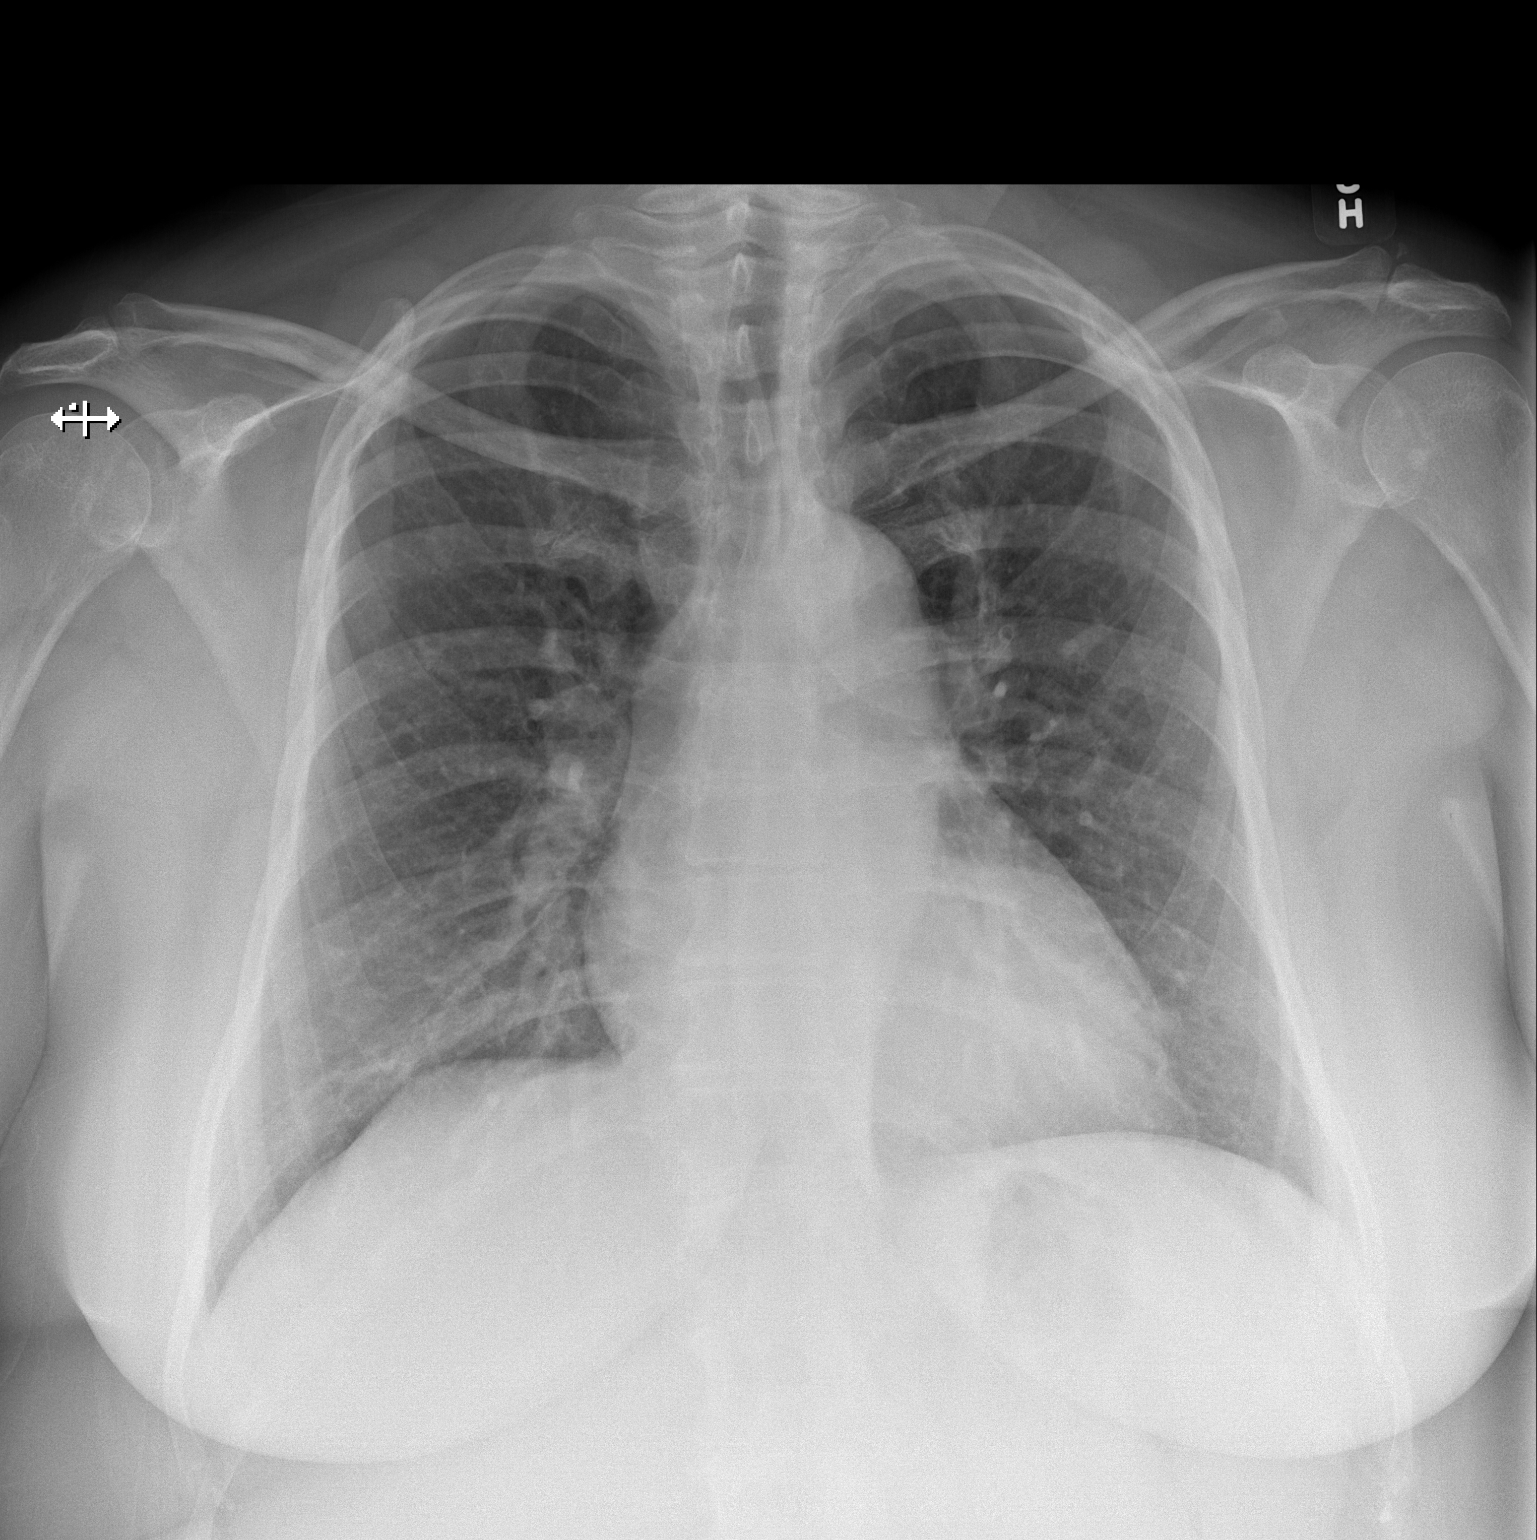

[w chest lat]
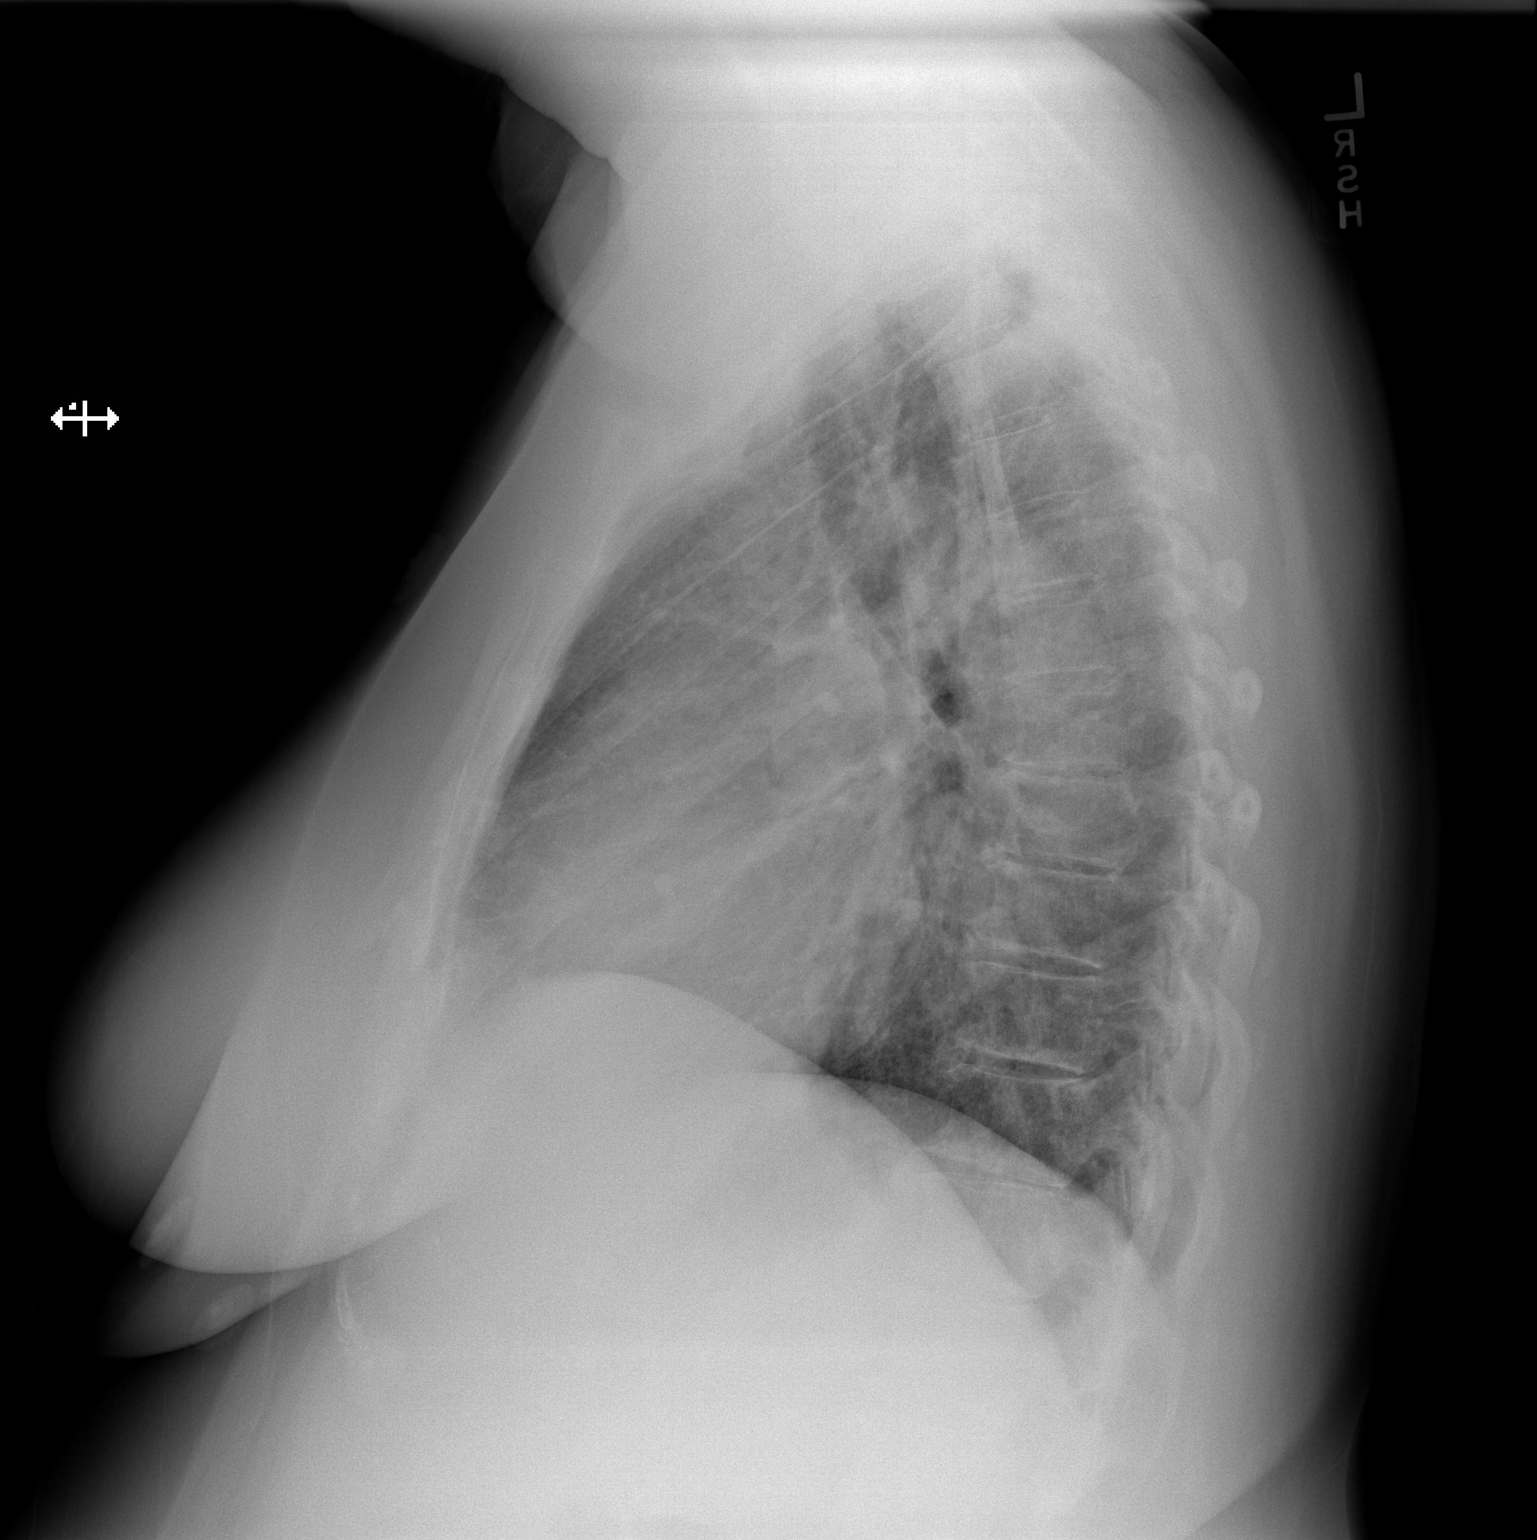

[2 of 2 positions shown; findings below may reference images not displayed]

FINDINGS: The heart size and mediastinal contours are within normal limits.
Both lungs are clear. The visualized skeletal structures are
unremarkable.
IMPRESSION: No active cardiopulmonary disease.

## 2021-06-16 DIAGNOSIS — G8929 Other chronic pain: Secondary | ICD-10-CM | POA: Diagnosis not present

## 2021-06-16 DIAGNOSIS — M1711 Unilateral primary osteoarthritis, right knee: Secondary | ICD-10-CM | POA: Diagnosis not present

## 2021-06-16 DIAGNOSIS — M25562 Pain in left knee: Secondary | ICD-10-CM | POA: Diagnosis not present

## 2021-08-11 DIAGNOSIS — E7439 Other disorders of intestinal carbohydrate absorption: Secondary | ICD-10-CM | POA: Diagnosis not present

## 2021-08-20 DIAGNOSIS — R233 Spontaneous ecchymoses: Secondary | ICD-10-CM | POA: Diagnosis not present

## 2021-08-24 DIAGNOSIS — Z01411 Encounter for gynecological examination (general) (routine) with abnormal findings: Secondary | ICD-10-CM | POA: Diagnosis not present

## 2021-08-24 DIAGNOSIS — Z124 Encounter for screening for malignant neoplasm of cervix: Secondary | ICD-10-CM | POA: Diagnosis not present

## 2021-08-24 DIAGNOSIS — N3941 Urge incontinence: Secondary | ICD-10-CM | POA: Diagnosis not present

## 2021-09-17 DIAGNOSIS — M1711 Unilateral primary osteoarthritis, right knee: Secondary | ICD-10-CM | POA: Diagnosis not present

## 2021-11-05 DIAGNOSIS — R051 Acute cough: Secondary | ICD-10-CM | POA: Diagnosis not present

## 2021-11-05 DIAGNOSIS — R0981 Nasal congestion: Secondary | ICD-10-CM | POA: Diagnosis not present

## 2021-11-05 DIAGNOSIS — R062 Wheezing: Secondary | ICD-10-CM | POA: Diagnosis not present

## 2021-11-05 DIAGNOSIS — J209 Acute bronchitis, unspecified: Secondary | ICD-10-CM | POA: Diagnosis not present

## 2021-11-18 DIAGNOSIS — I10 Essential (primary) hypertension: Secondary | ICD-10-CM | POA: Diagnosis not present

## 2021-11-18 DIAGNOSIS — R0781 Pleurodynia: Secondary | ICD-10-CM | POA: Diagnosis not present

## 2021-11-18 DIAGNOSIS — W109XXA Fall (on) (from) unspecified stairs and steps, initial encounter: Secondary | ICD-10-CM | POA: Diagnosis not present

## 2021-11-18 DIAGNOSIS — E119 Type 2 diabetes mellitus without complications: Secondary | ICD-10-CM | POA: Diagnosis not present

## 2021-11-18 DIAGNOSIS — M19011 Primary osteoarthritis, right shoulder: Secondary | ICD-10-CM | POA: Diagnosis not present

## 2021-11-18 DIAGNOSIS — M25511 Pain in right shoulder: Secondary | ICD-10-CM | POA: Diagnosis not present

## 2022-01-14 DIAGNOSIS — Z96652 Presence of left artificial knee joint: Secondary | ICD-10-CM | POA: Diagnosis not present

## 2022-01-24 DIAGNOSIS — J209 Acute bronchitis, unspecified: Secondary | ICD-10-CM | POA: Diagnosis not present

## 2022-01-24 DIAGNOSIS — J0111 Acute recurrent frontal sinusitis: Secondary | ICD-10-CM | POA: Diagnosis not present

## 2022-01-24 DIAGNOSIS — M791 Myalgia, unspecified site: Secondary | ICD-10-CM | POA: Diagnosis not present

## 2022-04-19 DIAGNOSIS — N3946 Mixed incontinence: Secondary | ICD-10-CM | POA: Diagnosis not present

## 2022-04-19 DIAGNOSIS — B9620 Unspecified Escherichia coli [E. coli] as the cause of diseases classified elsewhere: Secondary | ICD-10-CM | POA: Diagnosis not present

## 2022-04-19 DIAGNOSIS — N390 Urinary tract infection, site not specified: Secondary | ICD-10-CM | POA: Diagnosis not present

## 2022-04-29 DIAGNOSIS — G8929 Other chronic pain: Secondary | ICD-10-CM | POA: Diagnosis not present

## 2022-04-29 DIAGNOSIS — M25561 Pain in right knee: Secondary | ICD-10-CM | POA: Diagnosis not present

## 2022-06-09 DIAGNOSIS — N3020 Other chronic cystitis without hematuria: Secondary | ICD-10-CM | POA: Diagnosis not present

## 2022-06-09 DIAGNOSIS — N3946 Mixed incontinence: Secondary | ICD-10-CM | POA: Diagnosis not present

## 2022-06-09 DIAGNOSIS — N952 Postmenopausal atrophic vaginitis: Secondary | ICD-10-CM | POA: Diagnosis not present

## 2022-07-03 DIAGNOSIS — N390 Urinary tract infection, site not specified: Secondary | ICD-10-CM | POA: Diagnosis not present

## 2022-07-03 DIAGNOSIS — R509 Fever, unspecified: Secondary | ICD-10-CM | POA: Diagnosis not present

## 2022-07-03 DIAGNOSIS — R5381 Other malaise: Secondary | ICD-10-CM | POA: Diagnosis not present

## 2022-07-03 DIAGNOSIS — E860 Dehydration: Secondary | ICD-10-CM | POA: Diagnosis not present

## 2022-07-14 DIAGNOSIS — F419 Anxiety disorder, unspecified: Secondary | ICD-10-CM | POA: Diagnosis not present

## 2022-07-14 DIAGNOSIS — E119 Type 2 diabetes mellitus without complications: Secondary | ICD-10-CM | POA: Diagnosis not present

## 2022-07-14 DIAGNOSIS — Z6837 Body mass index (BMI) 37.0-37.9, adult: Secondary | ICD-10-CM | POA: Diagnosis not present

## 2022-07-14 DIAGNOSIS — I10 Essential (primary) hypertension: Secondary | ICD-10-CM | POA: Diagnosis not present

## 2022-08-24 DIAGNOSIS — M25561 Pain in right knee: Secondary | ICD-10-CM | POA: Diagnosis not present

## 2022-08-24 DIAGNOSIS — G8929 Other chronic pain: Secondary | ICD-10-CM | POA: Diagnosis not present

## 2022-10-12 DIAGNOSIS — R0981 Nasal congestion: Secondary | ICD-10-CM | POA: Diagnosis not present

## 2022-10-12 DIAGNOSIS — R059 Cough, unspecified: Secondary | ICD-10-CM | POA: Diagnosis not present

## 2022-12-16 DIAGNOSIS — N3000 Acute cystitis without hematuria: Secondary | ICD-10-CM | POA: Diagnosis not present

## 2022-12-16 DIAGNOSIS — E1165 Type 2 diabetes mellitus with hyperglycemia: Secondary | ICD-10-CM | POA: Diagnosis not present

## 2023-02-01 DIAGNOSIS — R0982 Postnasal drip: Secondary | ICD-10-CM | POA: Diagnosis not present

## 2023-02-01 DIAGNOSIS — J029 Acute pharyngitis, unspecified: Secondary | ICD-10-CM | POA: Diagnosis not present

## 2023-02-01 DIAGNOSIS — F322 Major depressive disorder, single episode, severe without psychotic features: Secondary | ICD-10-CM | POA: Diagnosis not present

## 2023-03-07 DIAGNOSIS — F322 Major depressive disorder, single episode, severe without psychotic features: Secondary | ICD-10-CM | POA: Diagnosis not present

## 2023-03-29 DIAGNOSIS — N390 Urinary tract infection, site not specified: Secondary | ICD-10-CM | POA: Diagnosis not present

## 2023-03-29 DIAGNOSIS — B9620 Unspecified Escherichia coli [E. coli] as the cause of diseases classified elsewhere: Secondary | ICD-10-CM | POA: Diagnosis not present

## 2023-05-31 DIAGNOSIS — R3915 Urgency of urination: Secondary | ICD-10-CM | POA: Diagnosis not present

## 2023-05-31 DIAGNOSIS — N3020 Other chronic cystitis without hematuria: Secondary | ICD-10-CM | POA: Diagnosis not present

## 2023-09-08 DIAGNOSIS — R0602 Shortness of breath: Secondary | ICD-10-CM | POA: Diagnosis not present
